# Patient Record
Sex: Male | Born: 1970 | Race: White | Hispanic: No | Marital: Single | Smoking: Never smoker
Health system: Southern US, Community
[De-identification: ages and names within clinical notes are randomized; demographics above are authoritative.]

## PROBLEM LIST (undated history)

## (undated) DIAGNOSIS — I2510 Atherosclerotic heart disease of native coronary artery without angina pectoris: Secondary | ICD-10-CM

## (undated) DIAGNOSIS — G4733 Obstructive sleep apnea (adult) (pediatric): Secondary | ICD-10-CM

## (undated) DIAGNOSIS — I219 Acute myocardial infarction, unspecified: Secondary | ICD-10-CM

## (undated) HISTORY — PX: CARDIAC SURGERY: SHX584

---

## 2003-11-10 ENCOUNTER — Ambulatory Visit (HOSPITAL_COMMUNITY): Admission: RE | Admit: 2003-11-10 | Discharge: 2003-11-10 | Payer: Self-pay | Admitting: Internal Medicine

## 2004-12-13 ENCOUNTER — Inpatient Hospital Stay (HOSPITAL_COMMUNITY): Admission: AD | Admit: 2004-12-13 | Discharge: 2004-12-16 | Payer: Self-pay | Admitting: Cardiology

## 2004-12-15 ENCOUNTER — Ambulatory Visit: Payer: Self-pay | Admitting: Cardiology

## 2004-12-27 ENCOUNTER — Ambulatory Visit: Payer: Self-pay | Admitting: *Deleted

## 2005-01-10 ENCOUNTER — Ambulatory Visit: Payer: Self-pay | Admitting: *Deleted

## 2005-01-17 ENCOUNTER — Ambulatory Visit: Payer: Self-pay | Admitting: Internal Medicine

## 2005-01-17 ENCOUNTER — Encounter (HOSPITAL_COMMUNITY): Admission: RE | Admit: 2005-01-17 | Discharge: 2005-02-16 | Payer: Self-pay | Admitting: *Deleted

## 2005-01-22 ENCOUNTER — Ambulatory Visit: Payer: Self-pay | Admitting: *Deleted

## 2005-04-04 ENCOUNTER — Ambulatory Visit: Payer: Self-pay | Admitting: *Deleted

## 2005-06-06 ENCOUNTER — Ambulatory Visit: Payer: Self-pay | Admitting: Cardiology

## 2005-10-17 ENCOUNTER — Emergency Department (HOSPITAL_COMMUNITY): Admission: EM | Admit: 2005-10-17 | Discharge: 2005-10-17 | Payer: Self-pay | Admitting: Emergency Medicine

## 2006-08-10 ENCOUNTER — Observation Stay (HOSPITAL_COMMUNITY): Admission: AD | Admit: 2006-08-10 | Discharge: 2006-08-11 | Payer: Self-pay | Admitting: Cardiology

## 2006-08-10 ENCOUNTER — Ambulatory Visit: Payer: Self-pay | Admitting: Cardiology

## 2007-08-23 ENCOUNTER — Emergency Department (HOSPITAL_COMMUNITY): Admission: EM | Admit: 2007-08-23 | Discharge: 2007-08-23 | Payer: Self-pay | Admitting: Emergency Medicine

## 2008-11-05 ENCOUNTER — Encounter: Payer: Self-pay | Admitting: Emergency Medicine

## 2008-11-05 ENCOUNTER — Ambulatory Visit: Payer: Self-pay | Admitting: Cardiology

## 2008-11-06 ENCOUNTER — Encounter: Payer: Self-pay | Admitting: Cardiology

## 2008-11-06 ENCOUNTER — Inpatient Hospital Stay (HOSPITAL_COMMUNITY): Admission: EM | Admit: 2008-11-06 | Discharge: 2008-11-07 | Payer: Self-pay | Admitting: Cardiology

## 2008-11-08 DIAGNOSIS — I10 Essential (primary) hypertension: Secondary | ICD-10-CM | POA: Insufficient documentation

## 2008-11-08 DIAGNOSIS — I2510 Atherosclerotic heart disease of native coronary artery without angina pectoris: Secondary | ICD-10-CM | POA: Insufficient documentation

## 2008-11-08 DIAGNOSIS — E785 Hyperlipidemia, unspecified: Secondary | ICD-10-CM | POA: Insufficient documentation

## 2009-04-19 ENCOUNTER — Observation Stay (HOSPITAL_COMMUNITY): Admission: EM | Admit: 2009-04-19 | Discharge: 2009-04-22 | Payer: Self-pay | Admitting: Emergency Medicine

## 2009-04-19 ENCOUNTER — Ambulatory Visit: Payer: Self-pay | Admitting: Cardiovascular Disease

## 2009-05-01 ENCOUNTER — Ambulatory Visit: Admission: RE | Admit: 2009-05-01 | Discharge: 2009-05-01 | Payer: Self-pay | Admitting: Pulmonary Disease

## 2009-05-02 ENCOUNTER — Encounter (INDEPENDENT_AMBULATORY_CARE_PROVIDER_SITE_OTHER): Payer: Self-pay | Admitting: *Deleted

## 2009-05-24 ENCOUNTER — Encounter (INDEPENDENT_AMBULATORY_CARE_PROVIDER_SITE_OTHER): Payer: Self-pay | Admitting: *Deleted

## 2010-04-24 NOTE — Letter (Signed)
Summary: Generic Letter, Intro to Referring  [facility]  [address]   [place], [place] [address]   Phone: [phone]  Fax: [phone]      May 24, 2009             RE: [name] [name]   1970-11-24                 [address]                 [place], [place]  [address]  Dear Kemper Durie    You referred this patient to our office for consult. We have tried to contact patient by phone and mail. Patient has failed to contact our office for appointment.   Sincerely,   [facility] Ph: [phone]   Fax: [phone]

## 2010-04-24 NOTE — Consult Note (Signed)
Summary: [facility]  [facility]   Imported By: Roderic Ovens 04/25/2009 10:04:24  _____________________________________________________________________  External Attachment:    Type:   Image     Comment:   External Document

## 2010-04-24 NOTE — Letter (Signed)
Summary: Appointment Reminder  [facility]  [address]   [place], [place] [address]   Phone: [phone]  Fax: [phone]       May 02, 2009   [name] [name] [address] [place], [place]  [address] 15-Sep-1970    Dear [name],  We have been unable to reach you by phone to schedule a follow up   appointment that was recommended for you by Dr. Darrick Penna. It is very   important that we reach you to schedule an appointment. We hope that you  allow [place] to participate in your health care needs. Please contact us at  [phone] at your earliest convenience to schedule your appointment.  Sincerely,    [facility] R. Roetta Sessions, M.D.    Kassie Mends, M.D. Lorenza Burton, FNP-BC    Tana Coast, PA-C Phone: [phone]    Fax: [phone]  Appended Document: Appointment Reminder LETTER RETURNED. WRONG ADDRESS. GLU

## 2010-06-09 LAB — CBC
HCT: 39.7 % (ref 39.0–52.0)
HCT: 40.9 % (ref 39.0–52.0)
HCT: 42 % (ref 39.0–52.0)
HCT: 44.3 % (ref 39.0–52.0)
Hemoglobin: 13.4 g/dL (ref 13.0–17.0)
Hemoglobin: 15.1 g/dL (ref 13.0–17.0)
MCHC: 33.8 g/dL (ref 30.0–36.0)
MCHC: 34 g/dL (ref 30.0–36.0)
MCHC: 34.5 g/dL (ref 30.0–36.0)
MCV: 93.9 fL (ref 78.0–100.0)
MCV: 94.4 fL (ref 78.0–100.0)
MCV: 94.8 fL (ref 78.0–100.0)
Platelets: 168 10*3/uL (ref 150–400)
Platelets: 181 10*3/uL (ref 150–400)
Platelets: 181 10*3/uL (ref 150–400)
Platelets: 189 10*3/uL (ref 150–400)
RBC: 4.19 MIL/uL — ABNORMAL LOW (ref 4.22–5.81)
RBC: 4.72 MIL/uL (ref 4.22–5.81)
RDW: 12.7 % (ref 11.5–15.5)
RDW: 12.8 % (ref 11.5–15.5)
RDW: 12.9 % (ref 11.5–15.5)
RDW: 13 % (ref 11.5–15.5)
WBC: 10.6 10*3/uL — ABNORMAL HIGH (ref 4.0–10.5)
WBC: 10.7 10*3/uL — ABNORMAL HIGH (ref 4.0–10.5)
WBC: 5.7 10*3/uL (ref 4.0–10.5)
WBC: 6.5 10*3/uL (ref 4.0–10.5)

## 2010-06-09 LAB — CK TOTAL AND CKMB (NOT AT ARMC)
CK, MB: 1.5 ng/mL (ref 0.3–4.0)
Relative Index: 1 (ref 0.0–2.5)
Total CK: 148 U/L (ref 7–232)

## 2010-06-09 LAB — POCT CARDIAC MARKERS
CKMB, poc: <1 ng/mL — ABNORMAL LOW (ref 1.0–8.0)
Myoglobin, poc: 57.2 ng/mL (ref 12–200)
Troponin i, poc: <0.05 ng/mL (ref 0.00–0.09)

## 2010-06-09 LAB — BASIC METABOLIC PANEL
BUN: 14 mg/dL (ref 6–23)
CO2: 22 mEq/L (ref 19–32)
Calcium: 9.5 mg/dL (ref 8.4–10.5)
Chloride: 104 mEq/L (ref 96–112)
Creatinine, Ser: 1.32 mg/dL (ref 0.4–1.5)
GFR calc Af Amer: >60 mL/min (ref >60)
GFR calc non Af Amer: >60 mL/min (ref >60)
Glucose, Bld: 104 mg/dL — ABNORMAL HIGH (ref 70–99)
Potassium: 3.7 mEq/L (ref 3.5–5.1)
Sodium: 137 mEq/L (ref 135–145)

## 2010-06-09 LAB — DIFFERENTIAL
Basophils Absolute: 0 10*3/uL (ref 0.0–0.1)
Basophils Absolute: 0 10*3/uL (ref 0.0–0.1)
Basophils Absolute: 0 10*3/uL (ref 0.0–0.1)
Basophils Absolute: 0 10*3/uL (ref 0.0–0.1)
Basophils Relative: 0 % (ref 0–1)
Basophils Relative: 0 % (ref 0–1)
Basophils Relative: 0 % (ref 0–1)
Eosinophils Absolute: 0.1 10*3/uL (ref 0.0–0.7)
Eosinophils Absolute: 0.1 10*3/uL (ref 0.0–0.7)
Eosinophils Absolute: 0.2 10*3/uL (ref 0.0–0.7)
Eosinophils Relative: 1 % (ref 0–5)
Eosinophils Relative: 1 % (ref 0–5)
Eosinophils Relative: 4 % (ref 0–5)
Eosinophils Relative: 5 % (ref 0–5)
Lymphocytes Relative: 16 % (ref 12–46)
Lymphocytes Relative: 16 % (ref 12–46)
Lymphocytes Relative: 31 % (ref 12–46)
Lymphs Abs: 1.7 10*3/uL (ref 0.7–4.0)
Lymphs Abs: 1.7 10*3/uL (ref 0.7–4.0)
Lymphs Abs: 2 10*3/uL (ref 0.7–4.0)
Monocytes Absolute: 0.8 10*3/uL (ref 0.1–1.0)
Monocytes Absolute: 1 10*3/uL (ref 0.1–1.0)
Monocytes Relative: 8 % (ref 3–12)
Monocytes Relative: 9 % (ref 3–12)
Neutro Abs: 7.8 10*3/uL — ABNORMAL HIGH (ref 1.7–7.7)
Neutro Abs: 8 10*3/uL — ABNORMAL HIGH (ref 1.7–7.7)
Neutrophils Relative %: 47 % (ref 43–77)
Neutrophils Relative %: 54 % (ref 43–77)
Neutrophils Relative %: 73 % (ref 43–77)
Neutrophils Relative %: 75 % (ref 43–77)

## 2010-06-09 LAB — CARDIAC PANEL(CRET KIN+CKTOT+MB+TROPI)
CK, MB: 1 ng/mL (ref 0.3–4.0)
CK, MB: 1 ng/mL (ref 0.3–4.0)
Relative Index: 0.9 (ref 0.0–2.5)
Relative Index: INVALID (ref 0.0–2.5)
Total CK: 112 U/L (ref 7–232)
Total CK: 90 U/L (ref 7–232)
Troponin I: 0.02 ng/mL (ref 0.00–0.06)
Troponin I: <0.01 ng/mL (ref 0.00–0.06)

## 2010-06-09 LAB — TROPONIN I: Troponin I: 0.01 ng/mL (ref 0.00–0.06)

## 2010-06-09 LAB — HEPARIN LEVEL (UNFRACTIONATED)
Heparin Unfractionated: 0.74 IU/mL — ABNORMAL HIGH (ref 0.30–0.70)
Heparin Unfractionated: 0.81 IU/mL — ABNORMAL HIGH (ref 0.30–0.70)
Heparin Unfractionated: 0.97 IU/mL — ABNORMAL HIGH (ref 0.30–0.70)
Heparin Unfractionated: <0.1 IU/mL — ABNORMAL LOW (ref 0.30–0.70)
Heparin Unfractionated: <0.1 IU/mL — ABNORMAL LOW (ref 0.30–0.70)

## 2010-06-09 LAB — PROTIME-INR
INR: 1.03 (ref 0.00–1.49)
Prothrombin Time: 13.4 seconds (ref 11.6–15.2)

## 2010-06-09 LAB — APTT: aPTT: 25 seconds (ref 24–37)

## 2010-06-29 LAB — BASIC METABOLIC PANEL
CO2: 25 mEq/L (ref 19–32)
Calcium: 8.9 mg/dL (ref 8.4–10.5)
Chloride: 105 mEq/L (ref 96–112)
GFR calc Af Amer: >60 mL/min (ref >60)
GFR calc non Af Amer: >60 mL/min (ref >60)
Glucose, Bld: 107 mg/dL — ABNORMAL HIGH (ref 70–99)
Potassium: 3.8 mEq/L (ref 3.5–5.1)
Potassium: 4 mEq/L (ref 3.5–5.1)
Sodium: 140 mEq/L (ref 135–145)
Sodium: 137 mEq/L (ref 135–145)

## 2010-06-29 LAB — DIFFERENTIAL
Basophils Absolute: 0 10*3/uL (ref 0.0–0.1)
Basophils Relative: 0 % (ref 0–1)
Eosinophils Relative: 2 % (ref 0–5)
Eosinophils Relative: 2 % (ref 0–5)
Lymphocytes Relative: 47 % — ABNORMAL HIGH (ref 12–46)
Lymphocytes Relative: 38 % (ref 12–46)
Lymphs Abs: 3.6 10*3/uL (ref 0.7–4.0)
Monocytes Absolute: 0.7 10*3/uL (ref 0.1–1.0)
Monocytes Absolute: 0.9 10*3/uL (ref 0.1–1.0)
Neutro Abs: 3.5 10*3/uL (ref 1.7–7.7)

## 2010-06-29 LAB — CARDIAC PANEL(CRET KIN+CKTOT+MB+TROPI)
CK, MB: 1.3 ng/mL (ref 0.3–4.0)
Relative Index: 1.1 (ref 0.0–2.5)
Relative Index: INVALID (ref 0.0–2.5)
Relative Index: INVALID (ref 0.0–2.5)
Troponin I: 0.01 ng/mL (ref 0.00–0.06)

## 2010-06-29 LAB — CBC
HCT: 40.2 % (ref 39.0–52.0)
HCT: 45.4 % (ref 39.0–52.0)
Hemoglobin: 13.8 g/dL (ref 13.0–17.0)
Hemoglobin: 15.6 g/dL (ref 13.0–17.0)
MCHC: 34.4 g/dL (ref 30.0–36.0)
Platelets: 178 10*3/uL (ref 150–400)
RDW: 13.1 % (ref 11.5–15.5)
RDW: 13 % (ref 11.5–15.5)
WBC: 9.4 10*3/uL (ref 4.0–10.5)

## 2010-06-29 LAB — COMPREHENSIVE METABOLIC PANEL
BUN: 14 mg/dL (ref 6–23)
CO2: 25 mEq/L (ref 19–32)
Calcium: 8.9 mg/dL (ref 8.4–10.5)
Chloride: 109 mEq/L (ref 96–112)
Creatinine, Ser: 1.37 mg/dL (ref 0.4–1.5)
GFR calc non Af Amer: 58 mL/min — ABNORMAL LOW (ref >60)
Glucose, Bld: 105 mg/dL — ABNORMAL HIGH (ref 70–99)
Total Bilirubin: 0.8 mg/dL (ref 0.3–1.2)

## 2010-06-29 LAB — LIPID PANEL
Cholesterol: 144 mg/dL (ref 0–200)
HDL: 43 mg/dL (ref >39)
LDL Cholesterol: 70 mg/dL (ref 0–99)
Total CHOL/HDL Ratio: 3.3 RATIO
Triglycerides: 154 mg/dL — ABNORMAL HIGH (ref <150)

## 2010-06-29 LAB — PROTIME-INR: Prothrombin Time: 13.8 seconds (ref 11.6–15.2)

## 2010-06-29 LAB — POCT CARDIAC MARKERS: Troponin i, poc: <0.05 ng/mL (ref 0.00–0.09)

## 2010-06-29 LAB — MAGNESIUM: Magnesium: 2.2 mg/dL (ref 1.5–2.5)

## 2010-06-29 LAB — TSH: TSH: 2.07 u[IU]/mL (ref 0.350–4.500)

## 2010-06-29 LAB — HEMOGLOBIN A1C
Hgb A1c MFr Bld: 5.5 % (ref 4.6–6.1)
Mean Plasma Glucose: 111 mg/dL

## 2010-07-04 ENCOUNTER — Other Ambulatory Visit: Payer: Self-pay | Admitting: Nurse Practitioner

## 2010-08-06 NOTE — H&P (Signed)
[name] [name], [name] [name]                  ACCOUNT NO.:  [id number]   MEDICAL RECORD NO.:  [id number]          PATIENT TYPE:  INP   LOCATION:  2113                         FACILITY:  [facility]   PHYSICIAN:  Dorian Pod, ACNP  DATE OF BIRTH:  06/21/1970   DATE OF ADMISSION:  08/10/2006  DATE OF DISCHARGE:                              HISTORY & PHYSICAL   CARDIOLOGIST:  Previously followed by Dionicio Stall, will be followed by  Donnamarie Rossetti now.   PRIMARY CARE PHYSICIAN:  Shaune Pollack.   Mr. Harju is a very pleasant 40 year old Caucasian gentleman with known  history of coronary artery disease, status post non-ST elevated MI in  September of 2006.  At that time, underwent cardiac catheterization with  placement of a bare metal stent to the right coronary artery.  The  patient was a participant in the Triton study.  Apparently followed up  with Dr. Dorethea Clan later that same year with complaints of intermittent  chest discomfort.  He was apparently removed from the Triton study and  started on Plavix at that time.  Details are unavailable.  He states he  has done well for the last couple of years until about 1 week ago on a  Monday, he was at work and experienced some stabbing chest discomfort  associated with some tightness substernally.  He states this lasted for  several hours.  He left work, went home and went to bed.  He took 2  nitroglycerin sublingually and the chest discomfort eased off.  He had  no further chest pain at that time.  He was okay until this Sunday  (yesterday) around 8 p.m., he was watching T.V. and once again  experienced substernal chest discomfort that started out as tightness  radiating to his left chest.  He states it lasted for several hours.  He  took nitroglycerin without relief.  He did not fell well though.  He  fell asleep about 2 a.m.  He woke up at 6 this morning, the chest  discomfort returned.  It was more intense.  He felt weak and dizzy.  He  denied any nausea,  vomiting, or diaphoresis.  He went to work, states he  just felt funny all morning.  Finally went to the ER at [facility].   PAST MEDICAL HISTORY:  1. Migraines.  2. Hypertension.  3. Coronary artery disease.  Status post cath in 2006 with seven      vessel disease.  Placement of a bare metal stent to the right      coronary artery.  Participant of the Triton study, however      currently on Plavix.  4. Dyslipidemia.  5. History of poly substance abuse.  Previous tobacco user, quit in      20 06.  Ongoing marijuana use, uses every other evening.  Drinks a 6-      pack of beer on the weekends.   ALLERGIES:  NO KNOWN DRUG ALLERGIES.   MEDICATIONS:  1. Caduet.  The patient thinks it is 5/80 mg daily.  2. Plavix 75.  3.  Aspirin 81.  4. Protonix 40.   SOCIAL HISTORY:  He lives in [place].  He lives alone.  He has never been  married.  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  No exercise.  Quit tobacco use in 2006.  Drinks a 6-pack on the  weekends.  Tries to follow a heart-healthy diet, he states this is  difficult as he is out of town frequently for work.  Ongoing marijuana  use.  Denies any crack cocaine or methamphetamine.   FAMILY HISTORY:  Mother alive and well with known history of coronary  artery disease, positive for stents.  Father alive and well with  coronary artery disease, status post MI.  Sister with no known coronary  artery disease.   REVIEW OF SYSTEMS:  Positive for headaches.  Blurred vision  occasionally.  Chest pain.  Mild dyspnea on exertion.  Coughing,  wheezing, nocturia, and GERD symptoms.  All other systems are negative  per the patient.   PHYSICAL EXAMINATION:  VITAL SIGNS:  Pulse 68, respirations 18, blood  pressure 120/72.  The patient is sating at 99% on two liters.  GENERAL:  He is in no acute distress.  He is rubbing his chest  complaining of chest pain.  On a scale of 1 to 10 it is currently a  five.  A very pleasant,  cooperative gentleman.  HEENT:  Normocephalic, atraumatic.  Pupils equal, round, and reactive to  light.  Sclerae clear.  Mucus membranes moist.  He has his own teeth.  NECK:  Supple without lymphadenopathy.  No bruits.  No JVD.  CARDIOVASCULAR EXAM:  S1, S2 regular rate and rhythm.  No murmurs, rubs,  or gallops heard.  Pulses are 2+ and equal without bruits.  LUNGS:  Clear to auscultation.  SKIN:  Warm and dry.  ABDOMEN:  Soft, nontender.  Positive bowel sounds.  EXTREMITIES:  Lower extremities without clubbing, cyanosis, or edema.  NEUROLOGICALLY:  Alert and oriented times 3.  Cranial nerves II-XII  grossly intact.   EKG obtained at [facility] shows sinus rhythm with T wave  inversions in the anterior leads.  A repeat 12-lead here showing sinus  rhythm with T wave inversions in the anterior leads.  No change from  previous EKG obtained this morning at [facility].  Baseline lab work  obtained at [facility].  H and H 15.3 and 43.6, WBC 7.9, platelets  208,000.  Sodium 137, potassium 3.8, chloride 108, CO2 23, BUN 17,  creatinine 1.2, with a glucose of 104.  Troponin point-of-care less than  0.05.  First set of cardiac enzymes negative troponin.  PT 12.5, INR  0.9.  None of these labs were obtained at [facility]  Patient  with complaints of chest discomfort with known history of coronary  artery disease.  Status post non ST elevated MI with a stent to the RCA  and in 2006.  History of dyslipidemia, hypertension, and poly substance  abuse.  Currently having ongoing chest pain.  Heparin, nitroglycerin,  and Integrilin already started at [facility] and continuing.  The patient will need cardiac catheterization for further evaluation.  Dr. Simona Huh went to examine the patient and is in agreement with  the above plan.  Will continue his calcium channel blocker, Protonix,  aspirin, Statin, and Plavix.  Check fasting lipids in the a.m.  Proceed with cardiac  catheterization for further evaluation of discomfort.      Dorian Pod, ACNP  MB/MEDQ  D:  08/10/2006  T:  08/10/2006  Job:  [id number]

## 2010-08-06 NOTE — Cardiovascular Report (Signed)
[name] [name], [name] [name]                  ACCOUNT NO.:  [id number]   MEDICAL RECORD NO.:  [id number]          PATIENT TYPE:  INP   LOCATION:  2507                         FACILITY[facility]   PHYSICIAN:  Noralyn Pick. Eden Emms, MD, FACCDATE OF BIRTH:  January 21, 1971   DATE OF PROCEDURE:  DATE OF DISCHARGE:                            CARDIAC CATHETERIZATION   INDICATION:  A 40 year old patient with chest pain, previous bare-metal  stent to the right coronary artery in 2006.   Cine catheterization was done from right femoral artery using 5-French  sheath.   Left main coronary artery was normal.   Left anterior descending artery was normal.   There was a medium-sized intermediate branch, which was normal.   First diagonal branch was normal.   Circumflex coronary artery was nondominant and normal.  First obtuse  marginal branch was normal.   Right coronary artery was dominant.  There was a stent in the distal  right coronary artery and the proximal mid and distal right coronary  artery were widely patent as were the PDA and posterior lateral branch.   RAO ventriculography:  RA ventriculography was normal.  EF was 55%.  There was no grain across aortic valve and no MR.  Aortic pressure was  115/70, LV pressure was 118/70.   IMPRESSION:  The patient has no coronary artery disease.  We did shoot a  single LAO aortogram to rule out aortic dissection since the patient had  significant chest pain and no coronary disease.  There was no evidence  of aortic dissection or aneurysm.  The patient was fairly bradycardic in  the lab with heart rates ranging from 42-55.  Giving him ice chips seem  to help.  He was always in sinus rhythm, but no heart block.  Since it  is late in the day and there is an issue with his relative bradycardia,  we will keep him until the morning.  If his heart rate is okay and he  has no further chest pain and his groin has healed well.  He will be  discharged in the morning.  I  suspect that a possible exercise treadmill  may be in order as an outpatient to rule out chronotropic incompetence.   The patient tolerated the procedure well.      Noralyn Pick. Eden Emms, MD, [facility]  Electronically Signed     PCN/MEDQ  D:  11/06/2008  T:  11/07/2008  Job:  [id number]

## 2010-08-06 NOTE — Discharge Summary (Signed)
NAMERYDER, MAN                  ACCOUNT NO.:  [id number]   MEDICAL RECORD NO.:  [id number]          PATIENT TYPE:  INP   LOCATION:  2507                         [facility]ACILITY[facility]   PHYSICIAN:  Jesse Sans. Wall, MD, FACCDATE OF BIRTH:  [birth year]   DATE OF ADMISSION:  11/06/2008  DATE OF DISCHARGE:  11/07/2008                               DISCHARGE SUMMARY   PRIMARY CARDIOLOGIST:  Gerrit Friends. Dietrich Pates, MD, [facility]   PRIMARY CARE Tanise Russman:  Venora Maples, MD   DISCHARGE DIAGNOSIS:  Chest pain.   SECONDARY DIAGNOSES:  1. Coronary artery disease, status post previous bare-metal stenting      of the right coronary artery.  2. Hypertension.  3. Hyperlipidemia.  4. Asymptomatic sinus bradycardia.   ALLERGIES:  No known drug allergies.   PROCEDURE:  Left heart cardiac catheterization revealing nonobstructive  coronary artery disease with normal left ventricular function and patent  previously placed right coronary artery stent.   HISTORY OF PRESENT ILLNESS:  A 40 year old Caucasian male with prior  history of CAD, who was in his usual state of health until November 05, 2008, when he presented to the [facility] ED with complaints of chest  pain.  There his cardiac enzymes were negative and ECG showed no acute  changes.  Decision was made to transfer to [facility] for further  evaluation.   HOSPITAL COURSE:  The patient was ruled out for MI.  He underwent left  heart cardiac catheterization on November 06, 2008, revealing normal  coronary arteries with patent RCA stent.  EF was 55%.  Mr. Kraai has had  no recurrent chest discomfort, be discharged home today in good  condition.  We have added low-dose aspirin as well as statin therapy to  his home regimen (the patient was not taking anything at home).   DISCHARGE LABORATORIES:  Hemoglobin 13.8, hematocrit 40.2, WBC 8.3, and  platelets 178.  INR 1.1.  Sodium 140, potassium 3.8, chloride 108, CO2  of 25, BUN 13, creatinine 1.22, and glucose  95.  Total bilirubin 0.8 and  alkaline phosphatase 31.  AST 18, ALT 16, total protein 6.1, albumin  3.7, calcium 8.9, magnesium 2.2, and hemoglobin A1c 5.5.  CK 78, MB 1.1,  troponin I 0.02, total cholesterol 144, triglycerides 154, HDL 43, and  LDL 70.  TSH is 2.07.   DISPOSITION:  The patient is being discharged home today in good  condition.   FOLLOWUP PLANS AND APPOINTMENTS:  We have arranged for Mr. lead to  follow up with Joni Reining, nurse practitioner at [facility] office  on November 21, 2008, at 2:30 p.m.  He is asked to follow up with Dr.  Juanetta Gosling as previously scheduled.   DISCHARGE MEDICATIONS:  1. Aspirin 81 mg daily.  2. Zocor 20 mg at bedtime.   OUTSTANDING LABORATORY STUDIES:  None.   DURATION OF DISCHARGE ENCOUNTER:  35 minutes including physician time.      Nicolasa Ducking, ANP      Jesse Sans. Daleen Squibb, MD, [facility]  Electronically Signed    CB/MEDQ  D:  11/07/2008  T:  11/07/2008  Job:  [id number]   cc:   Venora Maples

## 2010-08-06 NOTE — Discharge Summary (Signed)
NAMEASHAD, FAWBUSH                  ACCOUNT NO.:  [id number]   MEDICAL RECORD NO.:  [id number]          PATIENT TYPE:  INP   LOCATION:  6525                         [facility]   PHYSICIAN:  Veverly Fells. Excell Seltzer, MD  DATE OF BIRTH:  [birth year]   DATE OF ADMISSION:  08/10/2006  DATE OF DISCHARGE:  08/11/2006                               DISCHARGE SUMMARY   PRIMARY CARDIOLOGIST:  Gerrit Friends. Dietrich Pates, MD, [facility].   PRIMARY CARE Pammy Vesey:  Oneal Deputy. Juanetta Gosling, M.D.   PRINCIPAL DIAGNOSIS:  Chest pain.   SECONDARY DIAGNOSES:  1. Coronary artery disease status post bare metal stenting of the      right coronary artery in 2006.  2. Hypertension.  3. Hyperlipidemia.  4. History of polysubstance abuse including remote tobacco abuse,      quitting in 2006, and ongoing marijuana usage and EtOH usage.      Positive urine drug screen for cocaine on this admission  5. History of migraine headaches.   ALLERGIES:  No known drug allergies.   PROCEDURE:  Left heart cardiac catheterization and 2-D echocardiogram.   HISTORY OF PRESENT ILLNESS:  A  40 year old Caucasian male with prior  history of CAD status post non-ST-elevation MI in September 2006 with  subsequent PCI and stenting of the right coronary artery with bare metal  stent.  He had done well until approximately 1 week prior to admission  when he noted some stabbing chest discomfort associated with substernal  tightness lasting for several hours and eventually resolved following  two sublingual nitroglycerin.  He then did well until the evening of May  18 when, while watching TV, he had sudden onset of substernal chest  discomfort and tightness radiating to his left chest, again lasting  several hours. At this time unrelieved by sublingual nitroglycerin.  He  eventually fell off to sleep, and when he woke up on the morning of May  19, discomfort returned, and he presented to [facility].  Following evaluation there where ECG  showed sinus rhythm with T-wave  inversion in the anterior leads, decision made to transfer him to [facility] for further evaluation.   HOSPITAL COURSE:  The patient ruled out for MI by cardiac markers.  He  underwent left heart cardiac catheterization on May 19 which revealed  nonobstructive coronary artery disease with an EF of 60%.  The  previously placed stent was widely patent.  Post catheterization, the  patient complained of increasing chest discomfort which was pleuritic in  nature and worsen with deep breathing.  As a result, a D-dimer was  drawn, and this was normal at less than 0.22.  Further, a 2-D  echocardiogram was performed this afternoon which revealed an EF of 50-  55% with no wall motion abnormalities or evidence of pericardial  effusion.  The patient has been managed with ibuprofen with improvement  in his pleuritic chest discomfort.   He is being discharged home today in satisfactory condition.   DISCHARGE LABORATORY DATA:  Hemoglobin 14.5, hematocrit 43.1, WBC 7.7,  platelets 196.  PT 13.5, INR  1.0.  Sodium 139, potassium 4.1, chloride  108, CO2 25, BUN 17, creatinine 0.28, glucose 104. Total bilirubin 0.6,  alkaline phosphatase 30, AST 15, ALT 15, total protein 6.0, albumin 3.5,  calcium 9.1. CK 77, MB 1.2, troponin I 0.02. Total cholesterol 72,  triglycerides 110, HDL 27, LDL 23. Of note, his urine drug screen was  positive for cocaine, opiates, THC and benzodiazepines.   DISPOSITION:  The patient is being discharged home today in good  condition.  He has been counseled on the importance of complete  cessation of all drugs and alcohol.   FOLLOWUP APPOINTMENTS:  He is follow up with Dr. Dietrich Pates on June 3 at  1:15 p.m.  He has followup with Dr. Juanetta Gosling as previously scheduled.   DISCHARGE MEDICATIONS:  1. Aspirin 325 mg daily.  2. Plavix 75 mA daily.  3. Caduet 5/80 mg daily.  4. Protonix 40 mA daily.  5/  Nitroglycerin 0.4 mg sublingual p.r.n. chest  pain.  1. Xanax as previously prescribed.  2. Vicodin as previously prescribed.  3. Ibuprofen 200 mg 3 tablets q.6 h p.r.n. fever.   OUTSTANDING LABORATORY STUDIES:  None.   Duration of discharge encounter:  50 minutes including physician time.      Nicolasa Ducking, ANP      Veverly Fells. Excell Seltzer, MD  Electronically Signed    CB/MEDQ  D:  08/11/2006  T:  08/11/2006  Job:  [id number]   cc:   Gerrit Friends. Dietrich Pates, MD, [facility]  Oneal Deputy. Juanetta Gosling, M.D.

## 2010-08-06 NOTE — Cardiovascular Report (Signed)
NAMEJERAD, DUNLAP                  ACCOUNT NO.:  [id number]   MEDICAL RECORD NO.:  [id number]          PATIENT TYPE:  INP   LOCATION:  2807                         FACILITY[facility]   PHYSICIAN:  Everardo Beals. Juanda Chance, MD, FACCDATE OF BIRTH:  11/18/1970   DATE OF PROCEDURE:  08/10/2006  DATE OF DISCHARGE:                            CARDIAC CATHETERIZATION   CLINICAL HISTORY:  Mr. 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  He came to the emergency room  today with substernal chest pain not relieved with nitroglycerin.  He  was seen by Dr. Diona Browner.  The EKG showed anterior T wave changes.  Initial markers were negative.  A decision was made a take him to the  catheterization laboratory for further evaluation with angiography.   PROCEDURE:  The procedure was performed via the right femoral artery  using an arterial sheath and 6-French preformed coronary catheters.  A  frontal wall arterial puncture was performed. Omnipaque contrast was  used.  The patient tolerated the procedure well and left the laboratory  in satisfactory condition.  The right femoral was closed with Angio-Seal  at the end of the procedure.   RESULTS:  The left main coronary artery vein was free of significant  disease.   Left anterior descending artery gave rise to a large optional diagonal  branch and three septal perforators.  These and the LAD proper were free  of significant disease.   The circumflex artery gave rise to large ramus branch, a small marginal  branch, and a posterolateral branch.  These vessels were free of  significant disease.   The right coronary artery was a moderately large vessel that gave rise  to right conus branch, right ventricle branch, posterior descending  branch, and two posterolateral branches.  There was less than 10%  stenosis at the stent site in the distal right  coronary artery before  the posterior descending branch.   The left ventriculogram was performed in the RAO projection and showed  good wall motion with no areas of hypokinesis.  Estimated ejection  fraction was 6%.   Aortic pressure was 114/76 with mean of 88.  The left ventricular  pressure was 114/11.   CONCLUSION:  Minimal nonobstructive coronary artery disease with no  significant obstruction in the left anterior descending artery and  circumflex arteries, less than 10% stenosis at the stent site in the  distal right coronary artery, and normal left ventricular function with  estimated ejection fraction of 60%.   RECOMMENDATIONS:  The patient has no source of ischemia.  In view of  these findings, I think it is likely ischemia.  He does have slightly  slow flow in the LAD and right coronary arteries.  It took about three  cycles to completely fill the vessels.  The clinical significance is not  certain but probably not related to his chest pain.  Will plan  reassurance.  He also a history of polysubstance abuse possibly  could be  playing some role.      Bruce Elvera Lennox Juanda Chance, MD, [facility]  Electronically Signed     BRB/MEDQ  D:  08/10/2006  T:  08/10/2006  Job:  [id number]   cc:   Gerrit Friends. Dietrich Pates, MD, [facility]  Oneal Deputy. Juanetta Gosling, M.D.  Jonelle Sidle, MD  Cardiopulmonary Lab

## 2010-08-06 NOTE — H&P (Signed)
[name], [name] [name]                  ACCOUNT NO.:  [id number]   MEDICAL RECORD NO.:  [id number]          PATIENT TYPE:  INP   LOCATION:  2610                         FACILITY:  [facility]   PHYSICIAN:  Selena Batten, MD     DATE OF BIRTH:  [birth year]   DATE OF ADMISSION:  11/06/2008  DATE OF DISCHARGE:                              HISTORY & PHYSICAL   CHIEF COMPLAINTS:  Chest pain.   HISTORY OF PRESENT ILLNESS:  This is a 40 year old Caucasian male with a  known history of CAD status post BMS to his RCA, back in 2006 who  presents with onset of chest pain yesterday.  The patient awoke and had  breakfast on Sunday morning, 08/15, after which he developed this  squeezing chest pain across his precordium.  It was present throughout  the day and increased in intensity.  There were no precipitating or  relieving factors the patient was able to identify.  He presented to  [facility] emergency department for further evaluation.  On arrival, he  was placed on nitro drip, received morphine, was given aspirin and was  transferred for further evaluation.  On arrival, the patient had 1/10  chest pain, he was resting comfortably in bed.  He denies any other  recent symptoms.  He denies any orthopnea, PND, lower extremity edema.  He denies any decrease in his exercise tolerance apart from today.  Of  note, the patient states that he drinks 3-4 beers per day.   PAST MEDICAL HISTORY:  1. CAD status post PCI to the RCA in 2006 with a BMS.  2. Hypertension, presently diet controlled.  3. Hyperlipidemia, presently diet controlled.  4. Prior history of polysubstance abuse.   SOCIAL HISTORY:  He drinks 3-4 beers per day.  He does not smoke.   FAMILY HISTORY:  Multiple males in his family expired in their 78-50s  from heart disease.   REVIEW OF SYSTEMS:  A 14-point review of system was performed and is  negative except as per HPI.   PHYSICAL EXAMINATION:  VITAL SIGNS:  Blood pressure is 119/89 with  pulse  45, satting 98% on room air.  GENERAL:  No acute distress.  HEENT:  Head is normocephalic, atraumatic.  Eyes:  Pupils equal, round  and reactive to light.  Extraocular movements intact.  NECK:  Supple.  No masses.  Number of bruits.  No thyromegaly.  LUNGS:  Clear to auscultation bilaterally.  HEART:  Regular rate and rhythm without murmurs, rubs or gallops.  ABDOMEN:  Positive bowel sounds, soft, nontender, nondistended.  EXTREMITIES:  No clubbing, cyanosis or edema.  NEUROLOGICAL:  He is alert and oriented x3, moving all extremities x4.  PSYCHIATRIC:  Appropriate mood, judgment, insight.   STUDIES:  Chest x-ray is negative for any acute findings.  EKG is sinus  brady with precordial T-wave inversions.  Initial laboratory evaluation  is unrevealing.  Creatinine is 1.3.  CBC is unremarkable.  Cardiac  enzymes are unremarkable.   ASSESSMENT:  A 40 year old male with known CAD status post a BMS to the  RCA in  2006, hypertension, hyperlipidemia, and ethanol use who presents  with chest pain.  1. Chest pain.  While patient is on aspirin, nitro drip, heparin drip;      if he rules in, we will load and add the patient on Plavix.  Will      check a fasting lipid panel in a.m.  Consider cardiac      catheterization versus stress test.  Given the fact that his      symptoms are similar to previous MI, have discussed with the      patient the possibility of cardiac catheterization.  2. Hypertension.  Continue to monitor and add medications as necessary      to reach optimum goal.  3. Hyperlipidemia.  Will check fasting lipid panel in the a.m.      Consider adding a statin.  4. Ethanol use.  Will monitor closely.  If the patient develops any      anxiety or what not, will treat him with benzodiazepines.   DISPOSITION:  Pending further workup.      Selena Batten, MD  Electronically Signed     BS/MEDQ  D:  11/06/2008  T:  11/06/2008  Job:  [id number]

## 2010-08-09 NOTE — Cardiovascular Report (Signed)
NAMEBINH, [name] [name]                  ACCOUNT NO.:  [id number]   MEDICAL RECORD NO.:  [id number]          PATIENT TYPE:  INP   LOCATION:  3736                         FACILITY:  [facility]   PHYSICIAN:  Salvadore Farber, M.D. LHCDATE OF BIRTH:  03-Feb-1971   DATE OF PROCEDURE:  12/13/2004  DATE OF DISCHARGE:                              CARDIAC CATHETERIZATION   PROCEDURE PERFORMED:  Bare metal placement in the distal right coronary  artery.   CARDIOLOGIST:  Salvadore Farber, M.D.   INDICATIONS:  [name] [name] is a 40 year old gentleman with tobacco abuse and  family history of premature atherosclerotic disease who presents with  multiple episodes of chest pain starting at 2 P.M. this afternoon.  The pain  has been accompanied by nausea, shortness of breath and diaphoresis.  He has  also had some near syncope.  The pain has waxed and waned over the course of  the afternoon.  He is currently pain-free.  Electrocardiograms have  demonstrated anterior T wave inversions and transient inferior ST  elevations.  Dr. Dorethea Clan performed diagnostic angiography emergently.  This  demonstrated thrombus in the distal RCA.  I was asked to consider  percutaneous revascularization.   I reviewed the films.  The stenosis was mild; however, there was a large  thrombus in the distal vessels consistent with ruptured plaque.  This  territory fit with the inferior ST abnormalities as well as a focal area of  inferior hypokinesis.  Since the patient has had multiple episodes of chest  pain at rest including after the administration of aspirin and heparin, I  felt concerned that additional medial therapy may be ineffective in  preventing the completion of the myocardial infarction.  I therefore decided  to proceed with percutaneous revascularization.   PROCEDURAL TECHNIQUE:  There was a preexisting 6 French sheath in the right  common femoral artery.  Via the sheath a 6 [place] JR-4 guide was advanced  over the wire  and engaged in the ostium of the RCA.  A ProWater wire was  advanced in the distal RCA without difficulty.  The lesion was directly  stented using a 3.5 x 15 mm Vision deployed at 16 atmospheres.  Final  angiography demonstrated no residual stenosis, no residual thrombus, no  dissection and no distal embolization.   The patient tolerated the procedure well and was transferred to the CCU in  stable condition.   COMPLICATIONS:  None.   IMPRESSION AND RECOMMENDATIONS:  Successful bare metal stenting of the  ruptured plaque of the distal right coronary artery.   The patient is enrolled in the TRITON study comparing Plavix with a novel  thienopyridine.  He thus should not receive open label Plavix during  hospitalization or for the duration of the study.  He should be continued on  aspirin indefinitely.  Hydostatin will be initiated.  Smoking cessation has  been strongly advised.  NSAIDs are discontinued.  He will be given a beta  blocker.      Salvadore Farber, M.D. [facility]  Electronically Signed     WED/MEDQ  D:  12/13/2004  T:  12/16/2004  Job:  [id number]   cc:   Oneal Deputy. Juanetta Gosling, M.D.  Fax: [phone]   Vida Roller, M.D.  Fax: [phone]

## 2010-08-09 NOTE — Procedures (Signed)
NAMEKELIJAH, TOWRY                  ACCOUNT NO.:  [id number]   MEDICAL RECORD NO.:  [id number]          PATIENT TYPE:  REC   LOCATION:                                FACILITY:  [facility]   PHYSICIAN:  Vida Roller, M.D.   DATE OF BIRTH:  06-10-1970   DATE OF PROCEDURE:  01/17/2005  DATE OF DISCHARGE:                                    STRESS TEST   HISTORY:  Mr. Moehring is a 40 year old gentleman with coronary disease, status  post non-ST elevation MI in September 2006.  He was found to have a ruptured  plaque in his RCA and underwent bare metal stenting to this lesion.  He now  presents with recurrent exertional chest discomfort.   BASELINE DATA:  Electrocardiogram reveals sinus bradycardia at 53 beats per  minute with nonspecific ST abnormalities.  Blood pressure is 100/60.   The patient exercised for a total of 8 minutes and 29 seconds to Bruce  protocol, stage III, and 10.1 METS.  Maximum heart rate was 159 beats per  minute which is 86% of predicted.  Maximum blood pressure was 168/60.  The  patient reported chest tightness and shortness of breath with exercise that  resolved in recovery.  Electrocardiogram revealed few PVCs, no ischemic  changes noted.   Final images and results are pending M.D. review.      Jae Dire, P.A. [facility]      Vida Roller, M.D.  Electronically Signed    AB/MEDQ  D:  01/17/2005  T:  01/17/2005  Job:  [id number]

## 2010-08-09 NOTE — Discharge Summary (Signed)
NAMESTANCIL, [name] [name]                  ACCOUNT NO.:  [id number]   MEDICAL RECORD NO.:  [id number]          PATIENT TYPE:  INP   LOCATION:  3736                         FACILITY:  [facility]   PHYSICIAN:  Vida Roller, M.D.   DATE OF BIRTH:  05-19-1970   DATE OF ADMISSION:  12/13/2004  DATE OF DISCHARGE:  12/16/2004                                 DISCHARGE SUMMARY   PROCEDURES:  1.  Coronary catheterization.  2.  Coronary arteriogram.  3.  Left ventriculogram.  4.  PTCA and bare metal stent to the RCA.   DISCHARGE DIAGNOSES:  1.  Acute non-ST segment elevation inferior myocardial infarction secondary      to ruptured plaque in the right coronary artery, single vessel disease.  2.  Preserved left ventricular function with ejection fraction 55% and      inferior hypokinesis.  3.  History of migraine headaches.  4.  Ongoing tobacco use.  5.  History of polysubstance abuse, urine drug screen pending at the time of      dictation.  6.  Dyslipidemia with a total cholesterol of 138, triglycerides 172, HDL 31,      LDL 73.  7.  Status post colonoscopy with colon polyps.   HOSPITAL COURSE:  [name] [name] is a 40 year old male with no known history of  coronary artery disease.  He had sudden onset of substernal chest pain on  the day of admission that was associated with  nausea, shortness of breath,  and diaphoresis.  It was also associated with dizziness.  It was an 8/10.  He went to [facility] Emergency Room where nitroglycerin decreased  his pain, but it was not relieved.  He had ST segment depression inferiorly  and ongoing chest pain so he was transferred to [facility] and taken urgently to the catheterization lab.   The cardiac catheterization showed a 25% diagonal and a 25% RCA.  The RCA  also had a ruptured plaque.  He had inferior hypokinesis on his left  ventriculogram and it was felt that stent of the RCA was needed.   Dr. Samule Ohm placed a bare metal  stent to the RCA, reducing the stenosis from  25% to 0.  It stabilized the plaque.  He was enrolled in the TRITON study.  He is not to take Plavix but is to be on the TRITON study medication for now  and aspirin indefinitely.  He also had empiric Lipitor 80 added to his  medication regimen.   [name] [name] lipid profile showed dyslipidemia with an HDL of 31, LDL 73.  His  TSH was within normal limits but borderline low at 0.399.  His hemoglobin  A1c was 5.8.  He was counseled on smoking cessation.   [name] [name] had some pain in his groin and has significantly area of ecchymosis  but there was no hematoma or bruit.  He was seen by cardiac rehab and did  well.  He was evaluated by Dr. Dorethea Clan and considered stable for discharge on  December 16, 2004,with outpatient follow-up arranged.  DISCHARGE INSTRUCTIONS:  1.  His activity level is to include no driving for four days, no lifting      for three weeks.  2.  He is to stick to a low fat diet.  3.  He is to be followed by the research office as well as by [facility] in [place], [place].  He will see Jae Dire, P.A.-      C. on December 27, 2004, at 2 p.m.  4.  He is also to follow up with Dr. Juanetta Gosling for insomnia and other      medications to help continue smoking cessation (refused Wellbutrin).   DISCHARGE MEDICATIONS:  1.  TRITON study medications two pills daily.  2.  Aspirin 325 mg daily.  3.  Lipitor 80 mg daily.  4.  Norvasc 5 mg daily.  5.  Nitroglycerin sublingual p.r.n.  6.  Xanax 0.5 mg nightly p.r.n., 15 tablets no refills.      Theodore Demark, P.A. [facility]      Vida Roller, M.D.  Electronically Signed    RB/MEDQ  D:  12/16/2004  T:  12/17/2004  Job:  [id number]   cc:   Hilario Quarry, M.D.  Fax: [phone]   Oneal Deputy. Juanetta Gosling, M.D.  Fax: [phone]   Vida Roller, M.D.  Fax: [phone]

## 2010-08-09 NOTE — Op Note (Signed)
NAME:  [name] [name], [name] [name]                            ACCOUNT NO.:  [id number]   MEDICAL RECORD NO.:  [id number]                   PATIENT TYPE:  AMB   LOCATION:  DAY                                  FACILITY:  [facility]   PHYSICIAN:  Lionel December, M.D.                 DATE OF BIRTH:  07/15/1970   DATE OF PROCEDURE:  11/10/2003  DATE OF DISCHARGE:                                 OPERATIVE REPORT   PROCEDURE:  Total colonoscopy.   ENDOSCOPIST:  Lionel December, M.D.   INDICATIONS:  [name] [name] is a 40 year old Caucasian male with  intermittent/chronic hematochezia felt to be secondary to hemorrhoids, but  he recently had an episode where he passed a large amount of blood.  He is,  therefore, undergoing diagnostic colonoscopy.  The procedure and risks were  reviewed with the patient and informed consent was obtained. Family history  is negative for colorectal carcinoma.   PREOPERATIVE MEDICATIONS:  Demerol 25 mg IV and Versed 8 mg IV.   FINDINGS:  Procedure performed in endoscopy suite.  The patient's vital  signs and O2 saturation were monitored during the procedure and remained  stable.  The patient was placed in the left lateral recumbent position and  rectal examination was performed.  No abnormality noted on external or  digital exam.   Olympus videoscope was placed in the rectum and advanced under vision into  the sigmoid colon and beyond.  Preparation was satisfactory.  The scope was  passed to the cecum which was identified by intravenous and appendiceal  orifice.  A short segment of TI was also examined and was normal..  Pictures  were taken for the record.  As the scope was withdrawn the colonic mucosa  was carefully examined.  There was a small polyp at the proximal descending  colon which was ablated by a cold biopsy.  Another small polyp was at rectum  and this was also ablated by a cold biopsy.  No diverticula or other  abnormalities were noted.  Rectal mucosa was normal.   The scope  was retroflexed to examine the anorectal junction and moderate  sized hemorrhoids were noted below the dentate line.  The endoscope was  straightened and withdrawn.  The patient tolerated the procedure well.   FINAL DIAGNOSES:  1. Two small polyps that were ablated by a cold biopsy.  One from descending     colon and the other one from the rectum.  2. External hemorrhoids felt to the source of his intermittent hematochezia.   RECOMMENDATIONS:  1. High fiber diet.  2. I will be contacting the patient with the biopsy results and further     recommendations.      ___________________________________________  Lionel December, M.D.   NR/MEDQ  D:  11/10/2003  T:  11/10/2003  Job:  [id number]   cc:   Ramon Dredge L. Juanetta Gosling, M.D.  [address]  [place]  [place] [address]  Fax: [phone]

## 2010-08-09 NOTE — Cardiovascular Report (Signed)
[name] [name], [name] [name]                  ACCOUNT NO.:  [id number]   MEDICAL RECORD NO.:  [id number]          PATIENT TYPE:  INP   LOCATION:  3736                         FACILITY:  [facility]   PHYSICIAN:  Vida Roller, M.D.   DATE OF BIRTH:  1970-06-19   DATE OF PROCEDURE:  12/13/2004  DATE OF DISCHARGE:                              CARDIAC CATHETERIZATION   HISTORY OF PRESENT ILLNESS:  Mr. Space is a 40 year old man who has multiple  cardiac risk factors including ongoing tobacco abuse, who presented to [facility] with discomfort in his chest and abnormal electrocardiogram.  This discomfort persisted even with intravenous nitroglycerin, aspirin, and  heparin and he was taken emergently to the catheterization laboratory.   PROCEDURE:  1.  Left heart catheterization.  2.  Selective coronary angiography.  3.  Left ventriculography.   DESCRIPTION OF PROCEDURE:  After obtaining informed consent, the patient was  brought to the cardiac catheterization laboratory emergently. There, he was  prepped and draped in the usual sterile manner and localized technique was  obtained over the right groin using 1% Lidocaine without epinephrine. The  right femoral artery was cannulated using the modified Seldinger technique  with a 6 French 10 cm sheath and left heart catheterization was performed  using a 6 French Judkins left #4, a 6 French Judkins right #4, and a 6  French pigtail catheter. The pigtail catheter was used for left  ventriculography in the 30 degree RAO view. At the conclusion of the  procedure, the catheter was kept in place and the patient underwent  percutaneous re-vascularization under the care of Dr. Geralynn Rile.   RESULTS:  Aortic pressure 105/65 with a mean of 82 mmHg.   Left ventricular pressure 96/8 with an end-diastolic pressure of 21 mmHg.   CORONARY ANGIOGRAPHY:  The left main coronary artery was a large vessel,  which is angiographically normal.   The left  circumflex coronary artery is a moderate caliber vessel with two  obtuse margins and it is angiographically normal.   The left anterior descending coronary artery is a large trans-apical vessel.  There is a 25% stenosis in one of the diagonals.   The right coronary artery is a large dominant vessel. There was a ruptured  plaque seen in the mid portion of the vessel, prior to the crux. The  posterior descending coronary artery and the posterolateral branches have  TIMI 3 flow and appear to be angiographically unremarkable.   Left ventriculogram reveals a preserved left ventricular systolic function  with estimated ejection fraction of 55%. There is focal hypokinesis of the  inferior posterior wall.   ASSESSMENT:  1.  Acute non-ST segment elevation myocardial infarction.  2.  Ruptured plaque in the right coronary artery.  3.  Preserved left ventricular systolic function.   PLAN:  I have discussed this case with Dr. Geralynn Rile and consideration  will be made for percutaneous re-vascularization of this ruptured plaque.      Vida Roller, M.D.  Electronically Signed     JH/MEDQ  D:  12/13/2004  T:  12/16/2004  Job:  [id number]   cc:   Oneal Deputy. Juanetta Gosling, M.D.  Fax: [phone]

## 2010-08-09 NOTE — H&P (Signed)
NAME:  [name], [name]                              ACCOUNT NO.:  [id number]   MEDICAL RECORD NO.:  [id number]                   PATIENT TYPE:   LOCATION:                                       FACILITY:   PHYSICIAN:  Lionel December, M.D.                 DATE OF BIRTH:  04-05-1970   DATE OF ADMISSION:  10/31/2003  DATE OF DISCHARGE:                                HISTORY & PHYSICAL   CHIEF COMPLAINT:  Blood in stool.   PRIMARY CARE PHYSICIAN:  Self-referral.   HISTORY OF PRESENT ILLNESS:  Mr. [name] [name] is a 40 year old Caucasian gentleman  who presents today for further evaluation of hematochezia.  He states he has  seen intermittent small volume bright red blood per rectum in the past,  however, recently developed a moderate amount of bright red blood noted on  the toilet tissue.  He had noted it in conjunction with some diarrhea.  He  states he has three to four episodes of diarrhea per month, usually related  to consuming a significant amount of dairy products.  Otherwise, he tends to  have normal stool daily.  He denies any abdominal pain, nausea or vomiting,  heartburn.  He has gained about 20 pounds in the last two years which he  relates to dietary indiscretions.  He does complain of some urinary  frequency.  He wonders if he is having a prostate problem.   CURRENT MEDICATIONS:  None.   ALLERGIES:  No known drug allergies.   PAST MEDICAL HISTORY:  History of migraine headaches.   FAMILY HISTORY:  Mother has heart disease.  No family history of chronic GI  illnesses or colorectal cancer.   SOCIAL HISTORY:  He is single.  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  He smokes one pack of cigarettes daily.  He  consumes alcohol one or two days weekly, usually in the amount of three to  four beers each day.   REVIEW OF SYSTEMS:  Please see HPI for GI.  GENITOURINARY:  He complains of  urinary urgency, but no hesitancy.  CARDIOPULMONARY:  He denies any chest  pain or shortness of breath.   PHYSICAL EXAMINATION:  VITAL SIGNS:  Weight 212, height 6 feet.  Blood  pressure 100/64, pulse 60.  GENERAL:  A pleasant, well-developed, well-nourished, Caucasian gentleman in  no acute distress.  SKIN:  Warm and dry, no jaundice.  HEENT:  Conjunctivae are pink, sclerae nonicteric.  Oropharyngeal mucosa  moist and pink.  No lesions, erythema, or exudates.  NECK:  No lymphadenopathy, thyromegaly, carotid bruits.  CHEST:  Lungs are clear to auscultation.  CARDIAC:  Regular rate and rhythm, normal S1 and S2, no murmurs, rubs, or  gallops.  ABDOMEN:  Positive bowel sounds, soft nondistended.  He has mild suprapubic  tenderness to deep palpation.  No organomegaly or masses.  No rebound  tenderness or guarding.  EXTREMITIES:  No edema.   IMPRESSION:  The patient is a 40 year old gentleman with:  1. Hematochezia, small to moderate volume consisting of bright red blood.     This may be due to a benign anorectal source.  However, given several     episodes, we need to evaluate further via colonoscopy.  2. Urinary urgency and suprapubic abdominal pain on examination.  We will     check an urinalysis as initial diagnostic maneuver.   PLAN:  1. Colonoscopy.  2. Urinalysis with culture positive.     _____________________________________  ___________________________________________  Tana Coast, P.A.                      Lionel December, M.D.   LL/MEDQ  D:  10/31/2003  T:  10/31/2003  Job:  [id number]

## 2010-08-09 NOTE — H&P (Signed)
[name] [name], [name] [name]                  ACCOUNT NO.:  [id number]   MEDICAL RECORD NO.:  [id number]          PATIENT TYPE:  INP   LOCATION:  2919                         FACILITY:  [facility]   PHYSICIAN:  Vida Roller, M.D.   DATE OF BIRTH:  [birth year]   DATE OF ADMISSION:  12/13/2004  DATE OF DISCHARGE:                                HISTORY & PHYSICAL   SUMMARY OF HISTORY:  Mr. Pardini is a 40 year old white male who today at  approximately 2 p.m. 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  He described this as a sharp pressure.  Initially, the discomfort  was an 8 on a scale of 0 to 10, associated with nausea, shortness of breath,  and diaphoresis.  He sat down and became dizzy.  He went to [facility]  Emergency Room, where he received sublingual nitroglycerin and baby aspirin  which eased the discomfort down to a 2 to 3.  He was transported via  [facility] for further evaluation.  He has continued to have waxing and waning  chest discomfort.   PAST MEDICAL HISTORY:  No known drug allergies.   MEDICATIONS PRIOR TO ADMISSION:  Include Aleve.   MEDICATIONS THAT HE RECEIVED AT [place]:  Include aspirin, nitroglycerin,  heparin 5,000 units at 1,000 units drip.   HISTORY:  1.  Migraines.  2.  Colonoscopy, in August 2005, that showed colon polyps.  He denies any history of diabetes, hypertension, or thyroid disorder.   SOCIAL HISTORY:  He resides in [place] alone.  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  He smokes approximately a pack per day for the last  left 12 years.  He drinks a six-pack every weekend.  He denies drug use.  However, he is testing positive for marijuana.  He denies any herbal  medication, specific diet.   FAMILY HISTORY:  Notable for his mother alive with hypertension.  His  father's age and health status is unknown.  He has one sister.  Remote  family history is  positive for diabetes, peripheral vascular disease, and  coronary artery disease.   REVIEW OF SYSTEMS:  Notable for sweats, headache, dyspnea on exertion,  cough, arthralgias in his knees, GERD.   PHYSICAL EXAMINATION:  VITAL SIGNS:  Per Dr. Dorethea Clan, shows a blood pressure  127/76, pulse 54 and regular, respirations 16.  GENERAL:  A well-nourished, well-developed, white male in no apparent  distress.  HEENT:  Shows normocephalic, atraumatic.  PERRLA.  NECK:  Supple without thyromegaly, carotid bruits, or JVD.  HEART:  PMI is not displaced.  Regular rate and rhythm with a normal S1-S2.  He does have an S4.  I did not appreciate any murmurs, rubs, clicks, or  gallops.  CHEST:  Symmetrical excursion.  He has diffuse wheezing.  SKIN:  Integument is intact without rashes.  ABDOMEN:  Soft.  Bowel sounds present without organomegaly, masses, or  tenderness.  EXTREMITIES:  Negative cyanosis, clubbing, or edema.  He has 2+ distal  pulses bilaterally.  MUSCULOSKELETAL:  Does not show any joint deformity.  NEURO:  Alert and oriented x3. Cranial nerves II-XII grossly intact.   Chest x-ray at this time is pending.  EKG shows sinus brady with a rate of  57.  He has anterior T-wave inversion.  Old EKGs are not available for  comparison at this time.  H&H is 14.6 and 42.5, normal indices, platelets  232, WBCs 14.0.  PTT 24, PT 13.0.  Sodium 134, potassium 3.6, BUN 13,  creatinine 1.3, glucose 161.  All LFTs are within normal limits.  Two ER  markers at [facility] were within normal limits.   IMPRESSION:  1.  Chest discomfort compatible with unstable angina and multiple cardiac      risk factors.  EKG changes show anterior T-wave inversion with ongoing      chest discomfort.  2.  Tobacco use.  3.  Polysubstance abuse, however, he denies cocaine or methamphetamines.   DISPOSITION:  1.  Mr. Enyeart will be taken emergently to the catheterization lab.  2.  We will continue heparin, aspirin,  and start a 2B3A inhibitor.  3.  He will need a smoking cessation consult.  4.  We will obtain a urine drug screen as well as fasting lipids in the      morning.  5.  Further medication adjustment and recommendations will be pending future      findings.      Joellyn Rued, P.A. [facility]      Vida Roller, M.D.  Electronically Signed    EW/MEDQ  D:  12/13/2004  T:  12/13/2004  Job:  [id number]   cc:   Lionel December, M.D.  [address]  [place] [address]   Ramon Dredge L. Juanetta Gosling, M.D.  Fax: [phone]   Vida Roller, M.D.  Fax: [phone]

## 2014-03-26 ENCOUNTER — Inpatient Hospital Stay (HOSPITAL_COMMUNITY)
Admission: EM | Admit: 2014-03-26 | Discharge: 2014-04-04 | DRG: 011 | Disposition: A | Discharge status: Home / Self Care | Payer: 59 | Attending: Otolaryngology | Admitting: Otolaryngology

## 2014-03-26 ENCOUNTER — Emergency Department (HOSPITAL_COMMUNITY): Payer: 59

## 2014-03-26 ENCOUNTER — Encounter (HOSPITAL_COMMUNITY): Payer: Self-pay | Admitting: Emergency Medicine

## 2014-03-26 DIAGNOSIS — R0602 Shortness of breath: Secondary | ICD-10-CM

## 2014-03-26 DIAGNOSIS — Z7982 Long term (current) use of aspirin: Secondary | ICD-10-CM

## 2014-03-26 DIAGNOSIS — J9601 Acute respiratory failure with hypoxia: Secondary | ICD-10-CM

## 2014-03-26 DIAGNOSIS — J9811 Atelectasis: Secondary | ICD-10-CM

## 2014-03-26 DIAGNOSIS — J0430 Supraglottitis, unspecified, without obstruction: Secondary | ICD-10-CM | POA: Diagnosis not present

## 2014-03-26 DIAGNOSIS — E871 Hypo-osmolality and hyponatremia: Secondary | ICD-10-CM | POA: Diagnosis present

## 2014-03-26 DIAGNOSIS — J0510 Acute epiglottitis without obstruction: Secondary | ICD-10-CM

## 2014-03-26 DIAGNOSIS — R1310 Dysphagia, unspecified: Secondary | ICD-10-CM

## 2014-03-26 DIAGNOSIS — I2510 Atherosclerotic heart disease of native coronary artery without angina pectoris: Secondary | ICD-10-CM | POA: Diagnosis present

## 2014-03-26 DIAGNOSIS — R0603 Acute respiratory distress: Secondary | ICD-10-CM

## 2014-03-26 DIAGNOSIS — G4733 Obstructive sleep apnea (adult) (pediatric): Secondary | ICD-10-CM | POA: Diagnosis present

## 2014-03-26 DIAGNOSIS — N179 Acute kidney failure, unspecified: Secondary | ICD-10-CM | POA: Diagnosis present

## 2014-03-26 DIAGNOSIS — N189 Chronic kidney disease, unspecified: Secondary | ICD-10-CM | POA: Diagnosis present

## 2014-03-26 DIAGNOSIS — J9600 Acute respiratory failure, unspecified whether with hypoxia or hypercapnia: Secondary | ICD-10-CM | POA: Diagnosis present

## 2014-03-26 DIAGNOSIS — Z4659 Encounter for fitting and adjustment of other gastrointestinal appliance and device: Secondary | ICD-10-CM

## 2014-03-26 DIAGNOSIS — I9589 Other hypotension: Secondary | ICD-10-CM | POA: Diagnosis present

## 2014-03-26 DIAGNOSIS — E861 Hypovolemia: Secondary | ICD-10-CM | POA: Diagnosis present

## 2014-03-26 DIAGNOSIS — I252 Old myocardial infarction: Secondary | ICD-10-CM

## 2014-03-26 DIAGNOSIS — R451 Restlessness and agitation: Secondary | ICD-10-CM | POA: Diagnosis not present

## 2014-03-26 HISTORY — DX: Obstructive sleep apnea (adult) (pediatric): G47.33

## 2014-03-26 HISTORY — DX: Acute myocardial infarction, unspecified: I21.9

## 2014-03-26 HISTORY — DX: Atherosclerotic heart disease of native coronary artery without angina pectoris: I25.10

## 2014-03-26 LAB — BASIC METABOLIC PANEL
Anion gap: 11 (ref 5–15)
BUN: 18 mg/dL (ref 6–23)
CO2: 18 mmol/L — AB (ref 19–32)
CREATININE: 1.42 mg/dL — AB (ref 0.50–1.35)
Calcium: 9.3 mg/dL (ref 8.4–10.5)
Chloride: 104 mEq/L (ref 96–112)
GFR calc Af Amer: 68 mL/min — ABNORMAL LOW (ref >90)
GFR, EST NON AFRICAN AMERICAN: 58 mL/min — AB (ref >90)
Glucose, Bld: 115 mg/dL — ABNORMAL HIGH (ref 70–99)
POTASSIUM: 3.5 mmol/L (ref 3.5–5.1)
Sodium: 133 mmol/L — ABNORMAL LOW (ref 135–145)

## 2014-03-26 LAB — CBC WITH DIFFERENTIAL/PLATELET
BASOS ABS: 0 10*3/uL (ref 0.0–0.1)
BASOS PCT: 0 % (ref 0–1)
Eosinophils Absolute: 0 10*3/uL (ref 0.0–0.7)
Eosinophils Relative: 0 % (ref 0–5)
HCT: 41.9 % (ref 39.0–52.0)
Hemoglobin: 14.1 g/dL (ref 13.0–17.0)
LYMPHS PCT: 8 % — AB (ref 12–46)
Lymphs Abs: 1.7 10*3/uL (ref 0.7–4.0)
MCH: 31.5 pg (ref 26.0–34.0)
MCHC: 33.7 g/dL (ref 30.0–36.0)
MCV: 93.5 fL (ref 78.0–100.0)
Monocytes Absolute: 1.3 10*3/uL — ABNORMAL HIGH (ref 0.1–1.0)
Monocytes Relative: 6 % (ref 3–12)
Neutro Abs: 17.3 10*3/uL — ABNORMAL HIGH (ref 1.7–7.7)
Neutrophils Relative %: 86 % — ABNORMAL HIGH (ref 43–77)
Platelets: 192 10*3/uL (ref 150–400)
RBC: 4.48 MIL/uL (ref 4.22–5.81)
RDW: 12.9 % (ref 11.5–15.5)
WBC: 20.3 10*3/uL — AB (ref 4.0–10.5)

## 2014-03-26 MED ORDER — ONDANSETRON HCL 4 MG/2ML IJ SOLN
INTRAMUSCULAR | Status: AC
  Administered 2014-03-26: 4 mg via INTRAVENOUS
  Filled 2014-03-26: qty 2

## 2014-03-26 MED ORDER — FAMOTIDINE IN NACL 20-0.9 MG/50ML-% IV SOLN
20.0000 mg | Freq: Once | INTRAVENOUS | Status: AC
  Administered 2014-03-26: 20 mg via INTRAVENOUS
  Filled 2014-03-26: qty 50

## 2014-03-26 MED ORDER — MORPHINE SULFATE 4 MG/ML IJ SOLN
INTRAMUSCULAR | Status: AC
  Administered 2014-03-26: 4 mg via INTRAVENOUS
  Filled 2014-03-26: qty 1

## 2014-03-26 MED ORDER — ONDANSETRON HCL 4 MG/2ML IJ SOLN
4.0000 mg | Freq: Once | INTRAMUSCULAR | Status: AC
  Administered 2014-03-26: 4 mg via INTRAVENOUS

## 2014-03-26 MED ORDER — MORPHINE SULFATE 4 MG/ML IJ SOLN
4.0000 mg | Freq: Once | INTRAMUSCULAR | Status: AC
  Administered 2014-03-26: 4 mg via INTRAVENOUS

## 2014-03-26 MED ORDER — DEXTROSE 5 % IV SOLN
2.0000 g | Freq: Once | INTRAVENOUS | Status: AC
  Administered 2014-03-26: 2 g via INTRAVENOUS
  Filled 2014-03-26: qty 2

## 2014-03-26 MED ORDER — DIPHENHYDRAMINE HCL 50 MG/ML IJ SOLN
25.0000 mg | Freq: Once | INTRAMUSCULAR | Status: AC
  Administered 2014-03-26: 25 mg via INTRAVENOUS
  Filled 2014-03-26: qty 1

## 2014-03-26 MED ORDER — LORAZEPAM 2 MG/ML IJ SOLN
INTRAMUSCULAR | Status: AC
Start: 2014-03-26 — End: 2014-03-27
  Filled 2014-03-26: qty 1

## 2014-03-26 MED ORDER — LORAZEPAM 2 MG/ML IJ SOLN
0.5000 mg | Freq: Once | INTRAMUSCULAR | Status: AC
  Administered 2014-03-26: 0.5 mg via INTRAVENOUS

## 2014-03-26 MED ORDER — IPRATROPIUM-ALBUTEROL 0.5-2.5 (3) MG/3ML IN SOLN
3.0000 mL | Freq: Once | RESPIRATORY_TRACT | Status: AC
  Administered 2014-03-26: 3 mL via RESPIRATORY_TRACT
  Filled 2014-03-26: qty 3

## 2014-03-26 MED ORDER — METHYLPREDNISOLONE SODIUM SUCC 125 MG IJ SOLR
125.0000 mg | Freq: Once | INTRAMUSCULAR | Status: AC
  Administered 2014-03-26: 125 mg via INTRAVENOUS
  Filled 2014-03-26: qty 2

## 2014-03-26 NOTE — Progress Notes (Signed)
Patient appears to have upper airway swelling, sore throat,lungs clear , patient has trouble speaking.

## 2014-03-26 NOTE — ED Notes (Signed)
Much calmer s/p medications. O2 sats remain 100%. No longer spitting out saliva. Transported to [facility] ED via [facility]

## 2014-03-26 NOTE — ED Provider Notes (Signed)
CSN: [id number]     Arrival date & time 03/26/14  2142 History  This chart was scribed for [name] Melter, MD by [name] [name], ED Scribe. This patient was seen in room APA01/APA01 and the patient's care was started at 9:44 PM.    Chief Complaint  Patient presents with  . Respiratory Distress    The history is provided by the patient. No language interpreter was used.   HPI Comments: [name] [name] is a 44 y.o. male who presents to the Emergency Department complaining of new worsening sore throat onset 1 day prior. Pt presents with difficulty breathing. Pt rates the severity of his throat pain 10/10.   Denies Hx of bronchitis or asthma   Past Medical History  Diagnosis Date  . Acute MI    Past Surgical History  Procedure Laterality Date  . Cardiac surgery     No family history on file. History  Substance Use Topics  . Smoking status: Never Smoker   . Smokeless tobacco: Not on file  . Alcohol Use: Yes    Review of Systems  HENT: Positive for sore throat.   Respiratory: Positive for shortness of breath.   All other systems reviewed and are negative.    Allergies  Review of patient's allergies indicates no known allergies.  Home Medications   Prior to Admission medications   Medication Sig Start Date End Date Taking? Authorizing Provider  aspirin EC 81 MG tablet Take 81 mg by mouth daily.   Yes Historical Provider, MD  ibuprofen (ADVIL,MOTRIN) 200 MG tablet Take 800 mg by mouth every 6 (six) hours as needed for fever or mild pain.   Yes Historical Provider, MD  Pseudoeph-CPM-DM-APAP (TYLENOL COLD) 30-2-15-325 MG TABS Take 2 tablets by mouth daily as needed (FOR FEVER/SYMPTOMS).   Yes Historical Provider, MD  simvastatin (ZOCOR) 20 MG tablet TAKE ONE TABLET BY MOUTH EVERY DAY AT BEDTIME Patient not taking: Reported on 03/26/2014 07/04/10   Ok Anis, NP   Triage Vitals: Pulse 115  Temp(Src) 100.3 F (37.9 C) (Oral)  Resp 24  Wt 220 lb (99.791 kg)  SpO2 100%    Physical Exam  Constitutional: He is oriented to person, place, and time. He appears well-developed and well-nourished.  HENT:  Head: Normocephalic and atraumatic.  Right Ear: External ear normal.  Left Ear: External ear normal.  Eyes: Conjunctivae and EOM are normal. Pupils are equal, round, and reactive to light.  Neck: Normal range of motion and phonation normal. Neck supple.  Cardiovascular: Normal rate, regular rhythm and normal heart sounds.   Pulmonary/Chest: Breath sounds normal. He has no wheezes. He exhibits no bony tenderness.  decreased air movement bilaterally, no stridor. Spitting clear saliva. No increased work of breathing.    Abdominal: Soft. There is no tenderness.  Musculoskeletal: Normal range of motion.  Neurological: He is alert and oriented to person, place, and time. No cranial nerve deficit or sensory deficit. He exhibits normal muscle tone. Coordination normal.  Skin: Skin is warm, dry and intact.  Psychiatric: He has a normal mood and affect. His behavior is normal. Judgment and thought content normal.  Nursing note and vitals reviewed.   ED Course  Procedures (including critical care time) DIAGNOSTIC STUDIES: Oxygen Saturation is 100% on RA, normal by my interpretation.    COORDINATION OF CARE: 9:53 PM-Discussed treatment plan with pt at bedside and pt agreed to plan.   Medications  morphine 4 MG/ML injection (not administered)  ondansetron (ZOFRAN) 4 MG/2ML  injection (not administered)  morphine 4 MG/ML injection 4 mg (not administered)  ondansetron (ZOFRAN) injection 4 mg (not administered)  cefTRIAXone (ROCEPHIN) 2 g in dextrose 5 % 50 mL IVPB (not administered)  ipratropium-albuterol (DUONEB) 0.5-2.5 (3) MG/3ML nebulizer solution 3 mL (3 mLs Nebulization Given 03/26/14 2159)  methylPREDNISolone sodium succinate (SOLU-MEDROL) 125 mg/2 mL injection 125 mg (125 mg Intravenous Given 03/26/14 2152)  famotidine (PEPCID) IVPB 20 mg (0 mg Intravenous Stopped  03/26/14 2221)  diphenhydrAMINE (BENADRYL) injection 25 mg (25 mg Intravenous Given 03/26/14 2152)    Patient Vitals for the past 24 hrs:  BP Temp Temp src Pulse Resp SpO2 Weight  03/26/14 2203 - - - - - 100 % -  03/26/14 2200 128/83 mmHg - - 94 15 100 % -  03/26/14 2148 - 100.3 F (37.9 C) Oral 115 24 100 % 220 lb (99.791 kg)    10:05 PM Reevaluation with update and discussion. After initial assessment and treatment, an updated evaluation reveals he is more comfortable now, he is controlling his oral secretions better.  His tachypnea has improved.  Wife is here, and states that this problem began yesterday with a sore throat but then suddenly got worse tonight. [name] [name] L   11:02 PM Reevaluation with update and discussion. After initial assessment and treatment, an updated evaluation reveals he continues to complain of a burning sensation in his throat.  His voice sounds raspy, and is higher pitched, according to his wife.  Continued absence of stridor. [name] [name] L   Initial call to ENT placed at 22:59.  As of 2344, the physician and has not returned the page.  Therefore, he will be sent to a higher level of care, at [facility] hospital, for disposition and treatment.  The risk of keeping the patient here without a higher level of care, is too great to wait longer for a return call.  The patient will be sent by EMS, to [facility] hospital  CRITICAL CARE Performed by: [name] [name] Total critical care time: 50 minutes Critical care time was exclusive of separately billable procedures and treating other patients. Critical care was necessary to treat or prevent imminent or life-threatening deterioration. Critical care was time spent personally by me on the following activities: development of treatment plan with patient and/or surrogate as well as nursing, discussions with consultants, evaluation of patient's response to treatment, examination of patient, obtaining history from patient or surrogate,  ordering and performing treatments and interventions, ordering and review of laboratory studies, ordering and review of radiographic studies, pulse oximetry and re-evaluation of patient's condition.   Labs Review Labs Reviewed  CBC WITH DIFFERENTIAL - Abnormal; Notable for the following:    WBC 20.3 (*)    Neutrophils Relative % 86 (*)    Neutro Abs 17.3 (*)    Lymphocytes Relative 8 (*)    Monocytes Absolute 1.3 (*)    All other components within normal limits  BASIC METABOLIC PANEL - Abnormal; Notable for the following:    Sodium 133 (*)    CO2 18 (*)    Glucose, Bld 115 (*)    Creatinine, Ser 1.42 (*)    GFR calc non Af Amer 58 (*)    GFR calc Af Amer 68 (*)    All other components within normal limits    Imaging Review Dg Neck Soft Tissue  03/26/2014   CLINICAL DATA:  Acute onset of respiratory distress, with sore throat and fever. Initial encounter.  EXAM: NECK SOFT TISSUES - 1+ VIEW  COMPARISON:  None.  FINDINGS: There is marked thickening of the epiglottis and diffuse surrounding soft tissue edema, raising concern for significant epiglottitis. There is mild associated dilatation of the hypopharynx. Prevertebral soft tissues are difficult to fully assess but appear grossly unremarkable.  No acute osseous abnormalities are seen.  IMPRESSION: Marked thickening of the epiglottis and diffuse surrounding soft tissue edema, compatible with significant epiglottitis. Mild associated dilatation of the hypopharynx.  These results were called by telephone at the time of interpretation on 03/26/2014 at 10:56 pm to Dr. Mancel Bale, who verbally acknowledged these results.   Electronically Signed   By: [name] M.D.   On: 03/26/2014 22:58   Dg Chest Portable 1 View  03/26/2014   CLINICAL DATA:  Acute onset of respiratory distress and sore throat. Fever. Initial encounter.  EXAM: PORTABLE CHEST - 1 VIEW  COMPARISON:  Chest radiograph and CTA of the chest performed 04/19/2009  FINDINGS: The  lungs are well-aerated. Vascular congestion is noted. Bilateral perihilar opacities could reflect mild pneumonia. There is no evidence of pleural effusion or pneumothorax.  The cardiomediastinal silhouette is within normal limits. No acute osseous abnormalities are seen.  IMPRESSION: Vascular congestion noted. Bilateral perihilar opacities raise concern for mild pneumonia, given the patient's symptoms.   Electronically Signed   By: [name] M.D.   On: 03/26/2014 22:31     EKG Interpretation   Date/Time:  Sunday March 26 2014 21:56:57 EST Ventricular Rate:  98 PR Interval:  130 QRS Duration: 92 QT Interval:  320 QTC Calculation: 408 R Axis:   67 Text Interpretation:  Sinus rhythm Borderline repolarization abnormality  since last tracing no significant change Confirmed by Effie Shy  MD, Constantin Hillery  [phone]) on 03/26/2014 10:05:03 PM      MDM   Final diagnoses:  Dysphagia  Epiglottitis    Epiglottitis, in adults.  Airway is not in immediate danger of loss.  He needs transfer to a facility where he can be managed by ENT specialty, with anesthesia and surgical backup.  He is being transferred emergently to a higher level of care.  Nursing Notes Reviewed/ Care Coordinated, and agree without changes. Applicable Imaging Reviewed.  Interpretation of Laboratory Data incorporated into ED treatment  Plan: Transfer for care  I personally performed the services described in this documentation, which was scribed in my presence. The recorded information has been reviewed and is accurate.        [name] Melter, MD 03/26/14 [phone]

## 2014-03-26 NOTE — ED Notes (Signed)
Patient had sore throat yesterday, tonight began having shortness of breath.  Patient drooling on arrival to room, tachypneic and anxious.

## 2014-03-27 ENCOUNTER — Encounter (HOSPITAL_COMMUNITY): Payer: Self-pay | Admitting: Adult Health

## 2014-03-27 ENCOUNTER — Inpatient Hospital Stay (HOSPITAL_COMMUNITY): Payer: 59

## 2014-03-27 ENCOUNTER — Encounter (HOSPITAL_COMMUNITY)
Admission: EM | Disposition: A | Discharge status: Home / Self Care | Payer: Self-pay | Source: Home / Self Care | Attending: Otolaryngology

## 2014-03-27 ENCOUNTER — Emergency Department (HOSPITAL_COMMUNITY): Payer: 59 | Admitting: Certified Registered"

## 2014-03-27 DIAGNOSIS — R1310 Dysphagia, unspecified: Secondary | ICD-10-CM | POA: Diagnosis present

## 2014-03-27 DIAGNOSIS — R0603 Acute respiratory distress: Secondary | ICD-10-CM | POA: Insufficient documentation

## 2014-03-27 DIAGNOSIS — E861 Hypovolemia: Secondary | ICD-10-CM | POA: Diagnosis present

## 2014-03-27 DIAGNOSIS — R0602 Shortness of breath: Secondary | ICD-10-CM | POA: Diagnosis present

## 2014-03-27 DIAGNOSIS — N179 Acute kidney failure, unspecified: Secondary | ICD-10-CM | POA: Diagnosis present

## 2014-03-27 DIAGNOSIS — Z7982 Long term (current) use of aspirin: Secondary | ICD-10-CM | POA: Diagnosis not present

## 2014-03-27 DIAGNOSIS — J0430 Supraglottitis, unspecified, without obstruction: Principal | ICD-10-CM

## 2014-03-27 DIAGNOSIS — I9589 Other hypotension: Secondary | ICD-10-CM | POA: Diagnosis present

## 2014-03-27 DIAGNOSIS — J0510 Acute epiglottitis without obstruction: Secondary | ICD-10-CM

## 2014-03-27 DIAGNOSIS — J80 Acute respiratory distress syndrome: Secondary | ICD-10-CM

## 2014-03-27 DIAGNOSIS — E871 Hypo-osmolality and hyponatremia: Secondary | ICD-10-CM | POA: Diagnosis present

## 2014-03-27 DIAGNOSIS — G4733 Obstructive sleep apnea (adult) (pediatric): Secondary | ICD-10-CM | POA: Diagnosis present

## 2014-03-27 DIAGNOSIS — J9600 Acute respiratory failure, unspecified whether with hypoxia or hypercapnia: Secondary | ICD-10-CM | POA: Diagnosis present

## 2014-03-27 DIAGNOSIS — N189 Chronic kidney disease, unspecified: Secondary | ICD-10-CM | POA: Diagnosis present

## 2014-03-27 DIAGNOSIS — R451 Restlessness and agitation: Secondary | ICD-10-CM | POA: Diagnosis not present

## 2014-03-27 DIAGNOSIS — I252 Old myocardial infarction: Secondary | ICD-10-CM | POA: Diagnosis not present

## 2014-03-27 DIAGNOSIS — I2510 Atherosclerotic heart disease of native coronary artery without angina pectoris: Secondary | ICD-10-CM | POA: Diagnosis present

## 2014-03-27 HISTORY — PX: TRACHEOSTOMY TUBE PLACEMENT: SHX814

## 2014-03-27 LAB — APTT: aPTT: 28 seconds (ref 24–37)

## 2014-03-27 LAB — CBC
HCT: 37.6 % — ABNORMAL LOW (ref 39.0–52.0)
HEMOGLOBIN: 12.4 g/dL — AB (ref 13.0–17.0)
MCH: 30.7 pg (ref 26.0–34.0)
MCHC: 33 g/dL (ref 30.0–36.0)
MCV: 93.1 fL (ref 78.0–100.0)
Platelets: 170 10*3/uL (ref 150–400)
RBC: 4.04 MIL/uL — ABNORMAL LOW (ref 4.22–5.81)
RDW: 13 % (ref 11.5–15.5)
WBC: 17.9 10*3/uL — AB (ref 4.0–10.5)

## 2014-03-27 LAB — BLOOD GAS, ARTERIAL
ACID-BASE DEFICIT: 4.7 mmol/L — AB (ref 0.0–2.0)
Bicarbonate: 19.8 mEq/L — ABNORMAL LOW (ref 20.0–24.0)
Drawn by: 41881
FIO2: 0.4 %
MECHVT: 650 mL
O2 SAT: 97.2 %
PATIENT TEMPERATURE: 98.6
PEEP: 5 cmH2O
PO2 ART: 104 mmHg — AB (ref 80.0–100.0)
RATE: 14 resp/min
TCO2: 20.9 mmol/L (ref 0–100)
pCO2 arterial: 36 mmHg (ref 35.0–45.0)
pH, Arterial: 7.359 (ref 7.350–7.450)

## 2014-03-27 LAB — GLUCOSE, CAPILLARY
GLUCOSE-CAPILLARY: 169 mg/dL — AB (ref 70–99)
GLUCOSE-CAPILLARY: 102 mg/dL — AB (ref 70–99)
Glucose-Capillary: 164 mg/dL — ABNORMAL HIGH (ref 70–99)
Glucose-Capillary: 141 mg/dL — ABNORMAL HIGH (ref 70–99)
Glucose-Capillary: 131 mg/dL — ABNORMAL HIGH (ref 70–99)

## 2014-03-27 LAB — BASIC METABOLIC PANEL
ANION GAP: 8 (ref 5–15)
BUN: 13 mg/dL (ref 6–23)
CALCIUM: 8.5 mg/dL (ref 8.4–10.5)
CHLORIDE: 108 meq/L (ref 96–112)
CO2: 21 mmol/L (ref 19–32)
CREATININE: 1.39 mg/dL — AB (ref 0.50–1.35)
GFR calc Af Amer: 69 mL/min — ABNORMAL LOW (ref >90)
GFR calc non Af Amer: 60 mL/min — ABNORMAL LOW (ref >90)
Glucose, Bld: 174 mg/dL — ABNORMAL HIGH (ref 70–99)
POTASSIUM: 4 mmol/L (ref 3.5–5.1)
SODIUM: 137 mmol/L (ref 135–145)

## 2014-03-27 LAB — PROTIME-INR
INR: 1.22 (ref 0.00–1.49)
Prothrombin Time: 15.5 seconds — ABNORMAL HIGH (ref 11.6–15.2)

## 2014-03-27 LAB — CREATININE, SERUM
Creatinine, Ser: 1.43 mg/dL — ABNORMAL HIGH (ref 0.50–1.35)
GFR, EST AFRICAN AMERICAN: 67 mL/min — AB (ref >90)
GFR, EST NON AFRICAN AMERICAN: 58 mL/min — AB (ref >90)

## 2014-03-27 LAB — TROPONIN I: Troponin I: <0.03 ng/mL (ref <0.031)

## 2014-03-27 LAB — MRSA PCR SCREENING: MRSA by PCR: NEGATIVE

## 2014-03-27 LAB — OSMOLALITY, URINE: Osmolality, Ur: 291 mOsm/kg — ABNORMAL LOW (ref 390–1090)

## 2014-03-27 LAB — SODIUM, URINE, RANDOM: Sodium, Ur: <10 mmol/L

## 2014-03-27 LAB — OSMOLALITY: OSMOLALITY: 292 mosm/kg (ref 275–300)

## 2014-03-27 LAB — LACTIC ACID, PLASMA
LACTIC ACID, VENOUS: 2.1 mmol/L (ref 0.5–2.2)
Lactic Acid, Venous: 1.2 mmol/L (ref 0.5–2.2)

## 2014-03-27 SURGERY — CREATION, TRACHEOSTOMY
Anesthesia: Choice | Site: Throat

## 2014-03-27 MED ORDER — MIDAZOLAM BOLUS VIA INFUSION
1.0000 mg | INTRAVENOUS | Status: DC | PRN
  Administered 2014-03-27 (×4): 1 mg via INTRAVENOUS
  Filled 2014-03-27 (×5): qty 2

## 2014-03-27 MED ORDER — ALBUTEROL SULFATE (2.5 MG/3ML) 0.083% IN NEBU: 2.5000 mg | INHALATION_SOLUTION | RESPIRATORY_TRACT | Status: DC | PRN

## 2014-03-27 MED ORDER — LACTATED RINGERS IV SOLN
INTRAVENOUS | Status: DC | PRN
  Administered 2014-03-27: 01:00:00 via INTRAVENOUS

## 2014-03-27 MED ORDER — VITAL HIGH PROTEIN PO LIQD
1000.0000 mL | ORAL | Status: DC
  Filled 2014-03-27 (×3): qty 1000

## 2014-03-27 MED ORDER — MIDAZOLAM HCL 2 MG/2ML IJ SOLN
2.0000 mg | INTRAMUSCULAR | Status: DC | PRN
  Administered 2014-03-28 – 2014-03-29 (×5): 2 mg via INTRAVENOUS
  Filled 2014-03-27 (×5): qty 2

## 2014-03-27 MED ORDER — SODIUM CHLORIDE 0.9 % IJ SOLN
INTRAMUSCULAR | Status: AC
  Filled 2014-03-27: qty 10

## 2014-03-27 MED ORDER — PANTOPRAZOLE SODIUM 40 MG IV SOLR
40.0000 mg | INTRAVENOUS | Status: DC
  Administered 2014-03-27 – 2014-03-28 (×2): 40 mg via INTRAVENOUS
  Filled 2014-03-27 (×3): qty 40

## 2014-03-27 MED ORDER — LIDOCAINE HCL 2 % EX GEL
CUTANEOUS | Status: AC
  Filled 2014-03-27: qty 20

## 2014-03-27 MED ORDER — ASPIRIN 81 MG PO CHEW
81.0000 mg | CHEWABLE_TABLET | Freq: Every day | ORAL | Status: DC
  Administered 2014-03-27 – 2014-04-04 (×6): 81 mg
  Filled 2014-03-27 (×7): qty 1

## 2014-03-27 MED ORDER — ACETAMINOPHEN 10 MG/ML IV SOLN
1000.0000 mg | Freq: Four times a day (QID) | INTRAVENOUS | Status: DC
  Administered 2014-03-27: 1000 mg via INTRAVENOUS
  Filled 2014-03-27 (×3): qty 100

## 2014-03-27 MED ORDER — DEXMEDETOMIDINE HCL IN NACL 200 MCG/50ML IV SOLN
INTRAVENOUS | Status: AC
  Filled 2014-03-27: qty 50

## 2014-03-27 MED ORDER — SODIUM CHLORIDE 0.9 % IV SOLN
INTRAVENOUS | Status: DC
  Administered 2014-03-27 – 2014-03-31 (×5): via INTRAVENOUS

## 2014-03-27 MED ORDER — MIDAZOLAM HCL 2 MG/2ML IJ SOLN
INTRAMUSCULAR | Status: AC
  Filled 2014-03-27: qty 2

## 2014-03-27 MED ORDER — OXYMETAZOLINE HCL 0.05 % NA SOLN
NASAL | Status: AC
  Filled 2014-03-27: qty 15

## 2014-03-27 MED ORDER — SUFENTANIL CITRATE 50 MCG/ML IV SOLN
INTRAVENOUS | Status: AC
  Filled 2014-03-27: qty 1

## 2014-03-27 MED ORDER — SUFENTANIL CITRATE 50 MCG/ML IV SOLN
INTRAVENOUS | Status: DC | PRN
  Administered 2014-03-27: 10 ug via INTRAVENOUS

## 2014-03-27 MED ORDER — VITAL HIGH PROTEIN PO LIQD
1000.0000 mL | ORAL | Status: DC
  Administered 2014-03-28 – 2014-03-31 (×6): 1000 mL
  Filled 2014-03-27 (×6): qty 1000

## 2014-03-27 MED ORDER — MORPHINE SULFATE 4 MG/ML IJ SOLN
4.0000 mg | Freq: Once | INTRAMUSCULAR | Status: AC
  Administered 2014-03-27: 2 mg via INTRAVENOUS

## 2014-03-27 MED ORDER — SODIUM CHLORIDE 0.9 % IV SOLN
0.0000 mg/h | INTRAVENOUS | Status: DC
  Administered 2014-03-27: 3 mg/h via INTRAVENOUS
  Filled 2014-03-27: qty 10

## 2014-03-27 MED ORDER — OXYMETAZOLINE HCL 0.05 % NA SOLN
NASAL | Status: DC | PRN
  Administered 2014-03-27: 1 via NASAL

## 2014-03-27 MED ORDER — CHLORHEXIDINE GLUCONATE 0.12 % MT SOLN
15.0000 mL | Freq: Two times a day (BID) | OROMUCOSAL | Status: DC
  Administered 2014-03-27 – 2014-04-03 (×14): 15 mL via OROMUCOSAL
  Filled 2014-03-27 (×13): qty 15

## 2014-03-27 MED ORDER — SODIUM CHLORIDE 0.9 % IJ SOLN
3.0000 mL | Freq: Two times a day (BID) | INTRAMUSCULAR | Status: DC
  Administered 2014-03-27 – 2014-03-30 (×5): 3 mL via INTRAVENOUS

## 2014-03-27 MED ORDER — ENOXAPARIN SODIUM 40 MG/0.4ML ~~LOC~~ SOLN
40.0000 mg | SUBCUTANEOUS | Status: DC
  Administered 2014-03-28 – 2014-03-29 (×2): 40 mg via SUBCUTANEOUS
  Filled 2014-03-27 (×3): qty 0.4

## 2014-03-27 MED ORDER — FENTANYL CITRATE 0.05 MG/ML IJ SOLN
25.0000 ug | INTRAMUSCULAR | Status: DC | PRN
  Administered 2014-03-29 – 2014-03-30 (×2): 100 ug via INTRAVENOUS

## 2014-03-27 MED ORDER — CEFTRIAXONE SODIUM IN DEXTROSE 20 MG/ML IV SOLN
1.0000 g | Freq: Two times a day (BID) | INTRAVENOUS | Status: DC
  Administered 2014-03-27 – 2014-04-03 (×16): 1 g via INTRAVENOUS
  Filled 2014-03-27 (×18): qty 50

## 2014-03-27 MED ORDER — MIDAZOLAM HCL 2 MG/2ML IJ SOLN
2.0000 mg | Freq: Once | INTRAMUSCULAR | Status: AC
  Administered 2014-03-27: 2 mg via INTRAVENOUS

## 2014-03-27 MED ORDER — PROPOFOL INFUSION 10 MG/ML OPTIME
INTRAVENOUS | Status: DC | PRN
  Administered 2014-03-27: 50 ug/kg/min via INTRAVENOUS

## 2014-03-27 MED ORDER — PROPOFOL 10 MG/ML IV EMUL
5.0000 ug/kg/min | INTRAVENOUS | Status: DC
  Administered 2014-03-27: 40 ug/kg/min via INTRAVENOUS
  Administered 2014-03-27: 50 ug/kg/min via INTRAVENOUS
  Administered 2014-03-27: 30 ug/kg/min via INTRAVENOUS
  Filled 2014-03-27 (×3): qty 100

## 2014-03-27 MED ORDER — KCL IN DEXTROSE-NACL 20-5-0.45 MEQ/L-%-% IV SOLN
INTRAVENOUS | Status: DC
  Administered 2014-03-27: 03:00:00 via INTRAVENOUS
  Filled 2014-03-27 (×3): qty 1000

## 2014-03-27 MED ORDER — PROPOFOL 10 MG/ML IV BOLUS
INTRAVENOUS | Status: DC | PRN
  Administered 2014-03-27: 150 mg via INTRAVENOUS
  Administered 2014-03-27: 50 mg via INTRAVENOUS

## 2014-03-27 MED ORDER — SODIUM CHLORIDE 0.9 % IJ SOLN
3.0000 mL | INTRAMUSCULAR | Status: DC | PRN
  Administered 2014-03-27: 3 mL via INTRAVENOUS
  Filled 2014-03-27: qty 3

## 2014-03-27 MED ORDER — SODIUM CHLORIDE 0.9 % IV SOLN
INTRAVENOUS | Status: DC | PRN
  Administered 2014-03-27: 01:00:00 via INTRAVENOUS

## 2014-03-27 MED ORDER — MORPHINE SULFATE 2 MG/ML IJ SOLN
INTRAMUSCULAR | Status: AC
  Filled 2014-03-27: qty 2

## 2014-03-27 MED ORDER — LIDOCAINE HCL 2 % EX GEL
CUTANEOUS | Status: DC | PRN
  Administered 2014-03-27: 1 via INTRATRACHEAL

## 2014-03-27 MED ORDER — SODIUM CHLORIDE 0.9 % IV BOLUS (SEPSIS)
3000.0000 mL | Freq: Once | INTRAVENOUS | Status: AC
  Administered 2014-03-27: 3000 mL via INTRAVENOUS

## 2014-03-27 MED ORDER — SODIUM CHLORIDE 0.9 % IV SOLN
25.0000 ug/h | INTRAVENOUS | Status: DC
  Administered 2014-03-27 (×2): 400 ug/h via INTRAVENOUS
  Administered 2014-03-27: 25 ug/h via INTRAVENOUS
  Administered 2014-03-28 – 2014-03-31 (×10): 400 ug/h via INTRAVENOUS
  Filled 2014-03-27 (×15): qty 50

## 2014-03-27 MED ORDER — PROPOFOL 10 MG/ML IV EMUL
5.0000 ug/kg/min | INTRAVENOUS | Status: DC
  Administered 2014-03-27 (×2): 30 ug/kg/min via INTRAVENOUS
  Administered 2014-03-28: 50 ug/kg/min via INTRAVENOUS
  Administered 2014-03-28: 60 ug/kg/min via INTRAVENOUS
  Administered 2014-03-28: 20 ug/kg/min via INTRAVENOUS
  Administered 2014-03-28: 50 ug/kg/min via INTRAVENOUS
  Administered 2014-03-29 (×3): 60 ug/kg/min via INTRAVENOUS
  Administered 2014-03-29: 70 ug/kg/min via INTRAVENOUS
  Administered 2014-03-29 (×4): 60 ug/kg/min via INTRAVENOUS
  Administered 2014-03-29: 70 ug/kg/min via INTRAVENOUS
  Administered 2014-03-29: 60 ug/kg/min via INTRAVENOUS
  Administered 2014-03-30 – 2014-03-31 (×14): 70 ug/kg/min via INTRAVENOUS
  Filled 2014-03-27 (×32): qty 100

## 2014-03-27 MED ORDER — CETYLPYRIDINIUM CHLORIDE 0.05 % MT LIQD
7.0000 mL | Freq: Four times a day (QID) | OROMUCOSAL | Status: DC
  Administered 2014-03-27 – 2014-04-02 (×26): 7 mL via OROMUCOSAL

## 2014-03-27 MED ORDER — DEXMEDETOMIDINE HCL 200 MCG/2ML IV SOLN
INTRAVENOUS | Status: DC | PRN
  Administered 2014-03-27 (×2): 20 ug via INTRAVENOUS

## 2014-03-27 MED ORDER — MIDAZOLAM HCL 2 MG/2ML IJ SOLN
INTRAMUSCULAR | Status: DC | PRN
  Administered 2014-03-27 (×2): 1 mg via INTRAVENOUS

## 2014-03-27 SURGICAL SUPPLY — 39 items
BLADE SURG ROTATE 9660 (MISCELLANEOUS) IMPLANT
CANISTER SUCTION 2500CC (MISCELLANEOUS) ×3 IMPLANT
CLEANER TIP ELECTROSURG 2X2 (MISCELLANEOUS) ×3 IMPLANT
COVER SURGICAL LIGHT HANDLE (MISCELLANEOUS) ×3 IMPLANT
CRADLE DONUT ADULT HEAD (MISCELLANEOUS) IMPLANT
DECANTER SPIKE VIAL GLASS SM (MISCELLANEOUS) ×3 IMPLANT
ELECT COATED BLADE 2.86 ST (ELECTRODE) ×3 IMPLANT
ELECT REM PT RETURN 9FT ADLT (ELECTROSURGICAL) ×3
ELECTRODE REM PT RTRN 9FT ADLT (ELECTROSURGICAL) ×1 IMPLANT
GAUZE SPONGE 4X4 16PLY XRAY LF (GAUZE/BANDAGES/DRESSINGS) ×3 IMPLANT
GLOVE BIO SURGEON STRL SZ7.5 (GLOVE) ×3 IMPLANT
GOWN STRL REUS W/ TWL LRG LVL3 (GOWN DISPOSABLE) ×2 IMPLANT
GOWN STRL REUS W/TWL LRG LVL3 (GOWN DISPOSABLE) ×6
HOLDER TRACH TUBE VELCRO 19.5 (MISCELLANEOUS) ×3 IMPLANT
KIT BASIN OR (CUSTOM PROCEDURE TRAY) ×3 IMPLANT
KIT ROOM TURNOVER OR (KITS) ×3 IMPLANT
KIT SUCTION CATH 14FR (SUCTIONS) IMPLANT
NDL HYPO 25GX1X1/2 BEV (NEEDLE) IMPLANT
NEEDLE HYPO 25GX1X1/2 BEV (NEEDLE) IMPLANT
NS IRRIG 1000ML POUR BTL (IV SOLUTION) ×3 IMPLANT
PACK EENT II TURBAN DRAPE (CUSTOM PROCEDURE TRAY) ×3 IMPLANT
PAD ARMBOARD 7.5X6 YLW CONV (MISCELLANEOUS) ×6 IMPLANT
PENCIL BUTTON HOLSTER BLD 10FT (ELECTRODE) ×3 IMPLANT
SPONGE DRAIN TRACH 4X4 STRL 2S (GAUZE/BANDAGES/DRESSINGS) ×3 IMPLANT
SPONGE INTESTINAL PEANUT (DISPOSABLE) IMPLANT
SUT MON AB 3-0 SH 27 (SUTURE) ×3
SUT MON AB 3-0 SH27 (SUTURE) ×1 IMPLANT
SUT SILK 0 FSL (SUTURE) ×3 IMPLANT
SUT SILK 2 0 REEL (SUTURE) ×3 IMPLANT
SUT SILK 2 0 SH CR/8 (SUTURE) ×3 IMPLANT
SUT VIC AB 2-0 FS1 27 (SUTURE) IMPLANT
SYR 20ML ECCENTRIC (SYRINGE) ×3 IMPLANT
SYR BULB 3OZ (MISCELLANEOUS) ×3 IMPLANT
SYR CONTROL 10ML LL (SYRINGE) IMPLANT
TOWEL OR 17X24 6PK STRL BLUE (TOWEL DISPOSABLE) ×3 IMPLANT
TOWEL OR 17X26 10 PK STRL BLUE (TOWEL DISPOSABLE) ×3 IMPLANT
TUBE CONNECTING 12'X1/4 (SUCTIONS) ×1
TUBE CONNECTING 12X1/4 (SUCTIONS) ×2 IMPLANT
WATER STERILE IRR 1000ML POUR (IV SOLUTION) ×3 IMPLANT

## 2014-03-27 NOTE — Brief Op Note (Signed)
03/26/2014 - 03/27/2014  1:49 AM  PATIENT:  [name] [name]  44 y.o. male  PRE-OPERATIVE DIAGNOSIS:  epiglottitis  POST-OPERATIVE DIAGNOSIS:  epiglottitis  PROCEDURE:  Procedure(s): Fiberoptic Intubation (N/A)  SURGEON:  Surgeon(s) and Role:    * Christia Reading, MD - Primary  PHYSICIAN ASSISTANT:   ASSISTANTS: none   ANESTHESIA:   IV sedation  EBL:  Total I/O In: 400 [I.V.:400] Out: -   BLOOD ADMINISTERED:none  DRAINS: none   LOCAL MEDICATIONS USED:  OTHER Topical lidocaine  SPECIMEN:  No Specimen  DISPOSITION OF SPECIMEN:  N/A  COUNTS:  YES  TOURNIQUET:  * No tourniquets in log *  DICTATION: .Other Dictation: Dictation Number [id number]  PLAN OF CARE: Admit to inpatient   PATIENT DISPOSITION:  PACU - hemodynamically stable.   Delay start of Pharmacological VTE agent (>24hrs) due to surgical blood loss or risk of bleeding: no

## 2014-03-27 NOTE — ED Notes (Signed)
Wasted  morphine with Normajean Glasgow, RN.

## 2014-03-27 NOTE — Op Note (Signed)
NAMEJAHAD, OLD                  ACCOUNT NO.:  [id number]  MEDICAL RECORD NO.:  [id number]  LOCATION:  [facility]                         FACILITY:  [facility]  PHYSICIAN:  Antony Contras, MD     DATE OF BIRTH:  November 12, 1970  DATE OF PROCEDURE:  03/27/2014 DATE OF DISCHARGE:                              OPERATIVE REPORT   PREOPERATIVE DIAGNOSIS:  Adult supraglottitis.  POSTOPERATIVE DIAGNOSIS:  Adult supraglottitis.  PROCEDURE:  Fiberoptic intubation.  SURGEON:  Excell Seltzer. [name], MD  ANESTHESIA:  Sedation.  COMPLICATIONS:  None.  INDICATION:  The patient is a 44 year old male who has a 1-day history of increasing throat pain associated with difficulty swallowing and difficulty breathing.  Plain x-ray of the neck suggested supraglottitis and a fiberoptic exam at the bedside in the emergency room confirmed this with concern about potential for airway obstruction if the problem worsens.  Thus, he presents to the operating room for surgical intubation.  FINDINGS:  The epiglottis was thickened and purpuric but the glottic airway was not edematous.  The endotracheal tube was successfully placed via nasotracheal approach using a fiberoptic bronchoscope.  However, upon trying to confirm placement, the endotracheal tube came up above the glottis and cannot be replaced easily with the fiberoptic scope.  At that point, a GlideScope was used with the patient in a stable situation and the endotracheal tube was able to be placed orotracheally.  DESCRIPTION OF PROCEDURE:  The patient was identified in the emergency department and moved to the operating room, placed in a beach chair position.  The left nasal passage was packed with lidocaine soaked gauze and the anesthesia team applied topical anesthesia to the throat.  The gauze was taken out of the left side of the nose and the nose was sprayed with Afrin spray.  Couple of different nasal trumpets were then placed in increasing size, coated in  lidocaine gel.  Once these were all completed, the bronchoscope with a 7.0 endotracheal tube placed on it, was then passed through the left nasal passage and down past the epiglottis and into the trachea.  The endotracheal tube was then advanced over the fiberoptic scope into the airway.  Upon backing out the scope, the tube became dislodged and the scope was readvanced down into the airway and the tube successfully advanced over the scope into the trachea and the scope was then backed out.  The patient was successfully ventilated.  The scope was repositioned down the tube after a few minutes to check positioning and while trying to identify the positioning, the tube again became dislodged.  At this time, the airway was not easily seen with a fiberoptic scope because of secretions.  The patient was mask ventilated with success and endotracheal tube was removed.  A GlideScope was then used by the anesthesia team to visualize the larynx and a 7.5 endotracheal tube was then placed through the mouth into the larynx with success and advanced into the airway.  Breath sounds were confirmed as was CO2 on the monitor.  The tube was secured to the face using tape and he was given sedation throughout the process and then moved to the recovery room  in stable condition.     Antony Contras, MD     DDB/MEDQ  D:  03/27/2014  T:  03/27/2014  Job:  [id number]

## 2014-03-27 NOTE — ED Notes (Signed)
Wasted  morphine with Woody,RN.

## 2014-03-27 NOTE — Progress Notes (Signed)
Verbal Order Per Dr Tyson Alias, Daily Leak test to be completed.   Positive cuff leak today

## 2014-03-27 NOTE — Anesthesia Preprocedure Evaluation (Signed)
Anesthesia Evaluation  Patient identified by MRN, date of birth, ID band Patient awake    Reviewed: Allergy & Precautions, H&P , NPO status   Airway Mallampati: II  TM Distance: >3 FB Neck ROM: Full    Dental  (+) Teeth Intact, Dental Advisory Given   Pulmonary  - rhonchi   + stridor     Cardiovascular Rhythm:Regular Rate:Tachycardia     Neuro/Psych    GI/Hepatic   Endo/Other    Renal/GU      Musculoskeletal   Abdominal   Peds  Hematology   Anesthesia Other Findings   Reproductive/Obstetrics                             Anesthesia Physical Anesthesia Plan  ASA: IV and emergent  Anesthesia Plan: General   Post-op Pain Management:    Induction: Intravenous  Airway Management Planned: Oral ETT  Additional Equipment:   Intra-op Plan:   Post-operative Plan:   Informed Consent: I have reviewed the patients History and Physical, chart, labs and discussed the procedure including the risks, benefits and alternatives for the proposed anesthesia with the patient or authorized representative who has indicated his/her understanding and acceptance.     Plan Discussed with: CRNA and Anesthesiologist  Anesthesia Plan Comments: (44 year old male with epiglottis at risk for airway obstruction, plan awake intubation in OR.)        Anesthesia Quick Evaluation

## 2014-03-27 NOTE — ED Notes (Signed)
Dr. Bates at bedside 

## 2014-03-27 NOTE — ED Provider Notes (Signed)
Patient seen in transfer from [facility].  He was seen and diagnosed with epiglottitis.  Patient improved initially there with Solu-Medrol, ceftriaxone pain medicine and Ativan.  EMS reports in route .  He had several coughing spells which seemed to exacerbate his pain.  Patient was stridor and worsening shortness of breath.  Prior to arrival case was discussed with anesthesia.  Both come to the emergency department.  At this time, anesthesia has seen the patient and plans to take him to the operating room.  Upon pt's arrival, d/w Dr [name].   Patient is able to speak, is maintaining his own airway at this time.  His oxygen saturation saturations are 100%.  He is complaining of pain to the back of his throat.  Patient has been explained his disease process and the need for emergent intubation.  Olivia Mackie, MD 03/27/14 [phone]

## 2014-03-27 NOTE — ED Notes (Signed)
Patient arrived to Trauma A with EMS.  Dr Norlene Campbell at bedside with respiratory.  Patient with extreme shortness of breath with coughing.  Patient coughing up mucus.

## 2014-03-27 NOTE — Progress Notes (Signed)
Utilization review completed. Dresden Ament, RN, BSN. 

## 2014-03-27 NOTE — Progress Notes (Addendum)
S: Remains intubated and sedated.  Has been getting agitated at times.  Sedation increased. O: Sedated.  Orotracheally intubated. A/P: Adult supraglottis s/p intubation in OR Doing well.  Continue IV antibiotics and airway protection via endotracheal intubation.  Plan to leave endotracheal tube in place at least a couple of days before considering extubation, if cuff leak is present.  Appreciate CCM assistance.  OK to start tube feedings at any time.

## 2014-03-27 NOTE — Progress Notes (Signed)
INITIAL NUTRITION ASSESSMENT  DOCUMENTATION CODES Per approved criteria  -Not Applicable   INTERVENTION: Initiate Vital High Protein @ 20 ml/hr via OGT and increase by 10 ml every 4 hours to goal rate of 70 ml/hr.   Tube feeding regimen provides 1680 kcal (71% of needs- 2318 kcals (98% of needs) with current propofol rate), 147 grams of protein, and 1284 ml of H2O.   NUTRITION DIAGNOSIS: Inadequate oral intake related to inability to eat as evidenced by NPO.   Goal: Pt will meet >90% of estimated nutritional needs  Monitor:  Respiratory status, TF tolerance/advancement, labs, weight changes, I/O's  Reason for Assessment: VDRF, Consult for TF initiation and management  44 y.o. male  Admitting Dx: <principal problem not specified>  44 year old male with sore throat that started yesterday and worsened by the evening making it difficult to swallow. Breathing became more difficult like his throat was closing so he went to the ER at [facility] where a plain x-ray demonstrated a thickened epiglottis. He was transferred to [facility] after receiving solu-medrol and Rocephin among other things. He reports feeling better now than he did at [facility]. Still, his throat feels narrowed.  ASSESSMENT: Pt s/p PROCEDURE: Procedure(s) on 03/27/14: Fiberoptic Intubation (N/A)  Patient is currently intubated on ventilator support. OGT in place.  MV: 9.5 L/min Temp (24hrs), Avg:99.2 F (37.3 C), Min:97 F (36.1 C), Max:101.2 F (38.4 C)  Propofol: 24 ml/hr (634 kcals)  Adult Tube Feeding Protocol initiated by MD. Noted orders for Vital High Protein at 40 ml/hr, which will provide 960 kcals, 84 grams protein, and 803 ml fluid (which meets 41% of minimum estimated kcal needs and 70% of minimum estimated protein needs).  No signs of muscle or fat depletion noted.  Labs reviewed. Creat: 1.39. Glucose: 174. ZOXW:960-454.   Height: Ht Readings from Last 1 Encounters:  03/27/14  (1.88 m)     Weight: Wt Readings from Last 1 Encounters:  03/27/14 220 lb 7.4 oz (100 kg)    Ideal Body Weight: 190#  % Ideal Body Weight: 116%  Wt Readings from Last 10 Encounters:  03/27/14 220 lb 7.4 oz (100 kg)    Usual Body Weight: 220#  % Usual Body Weight: 100%  BMI:  Body mass index is 28.29 kg/(m^2). Overweight  Estimated Nutritional Needs: Kcal: 2374.6 Protein: 135-145 grams Fluid: >2.0 L  Skin: Intact  Diet Order: Diet NPO time specified  EDUCATION NEEDS: -Education not appropriate at this time   Intake/Output Summary (Last 24 hours) at 03/27/14 0949 Last data filed at 03/27/14 0800  Gross per 24 hour  Intake 1846.96 ml  Output    765 ml  Net 1081.96 ml    Last BM: PTA  Labs:   Recent Labs Lab 03/26/14 2140 03/27/14 0318 03/27/14 0624  NA 133*  --  137  K 3.5  --  4.0  CL 104  --  108  CO2 18*  --  21  BUN 18  --  13  CREATININE 1.42* 1.43* 1.39*  CALCIUM 9.3  --  8.5  GLUCOSE 115*  --  174*    CBG (last 3)   Recent Labs  03/27/14 0343 03/27/14 0732  GLUCAP 169* 164*    Scheduled Meds: . antiseptic oral rinse  7 mL Mouth Rinse QID  . aspirin  81 mg Per Tube Daily  . cefTRIAXone (ROCEPHIN)  IV  1 g Intravenous Q12H  . chlorhexidine  15 mL Mouth Rinse BID  . [  START ON 03/28/2014] enoxaparin (LOVENOX) injection  40 mg Subcutaneous Q24H  . feeding supplement (VITAL HIGH PROTEIN)  1,000 mL Per Tube Q24H  . morphine      . pantoprazole (PROTONIX) IV  40 mg Intravenous Q24H    Continuous Infusions: . sodium chloride 100 mL/hr at 03/27/14 0753  . fentaNYL infusion INTRAVENOUS 300 mcg/hr (03/27/14 0800)  . propofol 45 mcg/kg/min (03/27/14 1610)    Past Medical History  Diagnosis Date  . Acute MI   . OSA (obstructive sleep apnea)   . CAD (coronary artery disease)     PTCA and bare metal stent to the RCA 2006    Past Surgical History  Procedure Laterality Date  . Cardiac surgery      Shynia Daleo A. Mayford Knife, RD, LDN, CDE Pager:  [phone] After hours Pager: [phone]

## 2014-03-27 NOTE — H&P (Signed)
[name] [name] is an 44 y.o. male.   Chief Complaint: Supraglottitis HPI: 44 year old male with sore throat that started yesterday and worsened by the evening making it difficult to swallow.  Breathing became more difficult like his throat was closing so he went to the ER at [facility] where a plain x-ray demonstrated a thickened epiglottis.  He was transferred to [facility] after receiving solu-medrol and Rocephin among other things.  He reports feeling better now than he did at [facility].  Still, his throat feels narrowed.  Past Medical History  Diagnosis Date  . Acute MI     Past Surgical History  Procedure Laterality Date  . Cardiac surgery      No family history on file. Social History:  reports that he has never smoked. He does not have any smokeless tobacco history on file. He reports that he drinks alcohol. He reports that he uses illicit drugs (Marijuana).  Allergies: No Known Allergies   (Not in a hospital admission)  Results for orders placed or performed during the hospital encounter of 03/26/14 (from the past 48 hour(s))  CBC with Differential     Status: Abnormal   Collection Time: 03/26/14  9:40 PM  Result Value Ref Range   WBC 20.3 (H) 4.0 - 10.5 K/uL   RBC 4.48 4.22 - 5.81 MIL/uL   Hemoglobin 14.1 13.0 - 17.0 g/dL   HCT 41.9 39.0 - 52.0 %   MCV 93.5 78.0 - 100.0 fL   MCH 31.5 26.0 - 34.0 pg   MCHC 33.7 30.0 - 36.0 g/dL   RDW 12.9 11.5 - 15.5 %   Platelets 192 150 - 400 K/uL   Neutrophils Relative % 86 (H) 43 - 77 %   Neutro Abs 17.3 (H) 1.7 - 7.7 K/uL   Lymphocytes Relative 8 (L) 12 - 46 %   Lymphs Abs 1.7 0.7 - 4.0 K/uL   Monocytes Relative 6 3 - 12 %   Monocytes Absolute 1.3 (H) 0.1 - 1.0 K/uL   Eosinophils Relative 0 0 - 5 %   Eosinophils Absolute 0.0 0.0 - 0.7 K/uL   Basophils Relative 0 0 - 1 %   Basophils Absolute 0.0 0.0 - 0.1 K/uL  Basic metabolic panel     Status: Abnormal   Collection Time: 03/26/14  9:40 PM  Result Value Ref Range   Sodium  133 (L) 135 - 145 mmol/L    Comment: Please note change in reference range.   Potassium 3.5 3.5 - 5.1 mmol/L    Comment: Please note change in reference range.   Chloride 104 96 - 112 mEq/L   CO2 18 (L) 19 - 32 mmol/L   Glucose, Bld 115 (H) 70 - 99 mg/dL   BUN 18 6 - 23 mg/dL   Creatinine, Ser 1.42 (H) 0.50 - 1.35 mg/dL   Calcium 9.3 8.4 - 10.5 mg/dL   GFR calc non Af Amer 58 (L) >90 mL/min   GFR calc Af Amer 68 (L) >90 mL/min    Comment: (NOTE) The eGFR has been calculated using the CKD EPI equation. This calculation has not been validated in all clinical situations. eGFR's persistently <90 mL/min signify possible Chronic Kidney Disease.    Anion gap 11 5 - 15   Dg Neck Soft Tissue  03/26/2014   CLINICAL DATA:  Acute onset of respiratory distress, with sore throat and fever. Initial encounter.  EXAM: NECK SOFT TISSUES - 1+ VIEW  COMPARISON:  None.  FINDINGS: There is marked thickening of the epiglottis and diffuse surrounding soft tissue edema, raising concern for significant epiglottitis. There is mild associated dilatation of the hypopharynx. Prevertebral soft tissues are difficult to fully assess but appear grossly unremarkable.  No acute osseous abnormalities are seen.  IMPRESSION: Marked thickening of the epiglottis and diffuse surrounding soft tissue edema, compatible with significant epiglottitis. Mild associated dilatation of the hypopharynx.  These results were called by telephone at the time of interpretation on 03/26/2014 at 10:56 pm to Dr. Daleen Bo, who verbally acknowledged these results.   Electronically Signed   By: Garald Balding M.D.   On: 03/26/2014 22:58   Dg Chest Portable 1 View  03/26/2014   CLINICAL DATA:  Acute onset of respiratory distress and sore throat. Fever. Initial encounter.  EXAM: PORTABLE CHEST - 1 VIEW  COMPARISON:  Chest radiograph and CTA of the chest performed 04/19/2009  FINDINGS: The lungs are well-aerated. Vascular congestion is noted. Bilateral  perihilar opacities could reflect mild pneumonia. There is no evidence of pleural effusion or pneumothorax.  The cardiomediastinal silhouette is within normal limits. No acute osseous abnormalities are seen.  IMPRESSION: Vascular congestion noted. Bilateral perihilar opacities raise concern for mild pneumonia, given the patient's symptoms.   Electronically Signed   By: Garald Balding M.D.   On: 03/26/2014 22:31    Review of Systems  HENT: Positive for sore throat.   Respiratory: Positive for stridor.   All other systems reviewed and are negative.   Blood pressure 129/61, pulse 103, temperature 101.2 F (38.4 C), temperature source Oral, resp. rate 23, weight 99.791 kg (220 lb), SpO2 99 %. Physical Exam  Constitutional: He is oriented to person, place, and time. He appears well-developed and well-nourished. He appears distressed (Moderate respiratory distress.).  HENT:  Head: Normocephalic and atraumatic.  Right Ear: External ear normal.  Left Ear: External ear normal.  Nose: Nose normal.  Mouth/Throat: Oropharynx is clear and moist.  Mild muffled hoarseness.  Eyes: Conjunctivae and EOM are normal. Pupils are equal, round, and reactive to light.  Neck: Normal range of motion. Neck supple.  Laryngeal tenderness.  Cardiovascular: Normal rate.   Respiratory: He is in respiratory distress (Mild inspiratory stridor with deep inspiration.).  Musculoskeletal: Normal range of motion.  Neurological: He is alert and oriented to person, place, and time. No cranial nerve deficit.  Skin: Skin is warm and dry.  Psychiatric: He has a normal mood and affect. His behavior is normal. Judgment and thought content normal.     Assessment/Plan Acute supraglottitis Fiberoptic exam was performed at the bedside.  See procedure note.  I discussed his case with Dr. Audelia Hives of Anesthesiology.  We agreed to proceed with fiberoptic transnasal intubation in the operating room with tracheostomy as the backup plan.   Risks, benefits, and alternatives were discussed with the patient and his wife, who agree to proceed.  He will be admitted to ICU after intubation for IV antibiotic therapy and extubation will be considered in a couple of days or when a cuff-leak develops.  Azalie Harbeck 03/27/2014, 12:57 AM

## 2014-03-27 NOTE — Procedures (Signed)
Surgeon: [name] Anesth: Topical with 4% lidocaine gel Compl: None Findings: Supraglottic larynx with marked edema, inflammation with purpuric appearance.  Glottis visible and mobile but view a bit limited by epiglottic inflammation. Description: After discussing risks, benefits, and alternatives, the fiberoptic laryngoscope was introduced via the left nasal passage coated with lidocaine gel.  The scope was used to examine the pharynx and larynx.  The scope was then removed and the patient was returned to nursing care in stable condition.

## 2014-03-27 NOTE — Progress Notes (Addendum)
Sinus Bradycardia 43- 52 and systolic blood pressure 82-84 systolic.  At 1100 began decreasing propofol slowly to see if sbp and heart rate would increase. At 1145 systolic pressure up to 88.and remained SB 43-46. Turned propofol off. At 1200, patient awakened and became very agitated and was trying to pull ETT. HR up into 60's and sbp increased to lower 90's. Propofol restarted at 30 mcg/kg/min. Dr. Tyson Alias paged at 1131 and 1156.  Dr. Tyson Alias returned call at 1217. Updated on patient's condition as above and orders received.

## 2014-03-27 NOTE — Transfer of Care (Signed)
Immediate Anesthesia Transfer of Care Note  Patient: [name] [name]  Procedure(s) Performed: Procedure(s): Fiberoptic Intubation (N/A)  Patient Location: ICU  Anesthesia Type:MAC and General  Level of Consciousness: sedated and Patient remains intubated per anesthesia plan  Airway & Oxygen Therapy: Patient remains intubated per anesthesia plan and Patient placed on Ventilator (see vital sign flow sheet for setting)  Post-op Assessment: Post -op Vital signs reviewed and stable and Report given to ICU RN  Post vital signs: Reviewed and stable  Complications: No apparent anesthesia complications

## 2014-03-27 NOTE — Progress Notes (Signed)
   03/27/14 1715  Vitals  Pulse Rate 85  ECG Heart Rate 83  Resp (!) 23  Oxygen Therapy  SpO2 98 %  Pre-WUA / WUA Start  [place] Agitation Sedation Scale (RASS) 4  RASS Goal -2  Pain Assessment  Pain Intervention(s) Medication (See eMAR);Repositioned;Emotional support (bolused with 2 mg total versed and increased drip)  Critical Care Pain Observation Tool (CPOT)  Facial Expression 2  Body Movements 2  Muscle Tension 2  Compliance with ventilator (intubated pts.) 2  Vocalization (extubated pts.) N/A  CPOT Total 8  Glasgow Coma Scale  Eye Opening 4  Modified Verbal Response (INTUBATED) 3  Best Motor Response 6 (intermittent.)  Glasgow Coma Scale Score 13  Patient very agitated, diaphoretic, pulling on restraints, trying to sit up and attempting to pull out ETT. It did get disconnected by removed. Attempting to hold patient in bed and prevent removal of tube. Fentanyl is maxed at 400 mcg/hr and Midazolam was at 3 mg. Increased to 6 mg/hr. Had Elink camera in and Dr. Kendrick Fries was able to visualize the agitation. Order received for propofol and will be discontinuing the midazolam. Also notified Dr. Kendrick Fries of eleveated CBGs 140-160's. To notify him if the go above 180 mg/dl.

## 2014-03-27 NOTE — ED Notes (Signed)
To OR at this time.

## 2014-03-27 NOTE — ED Notes (Signed)
Wasted 2mg morphine with Woody,RN. 

## 2014-03-27 NOTE — Consult Note (Signed)
PULMONARY / CRITICAL CARE MEDICINE   Name: [name] [name] MRN: [id number] DOB: [birth year]    ADMISSION DATE:  03/26/2014 CONSULTATION DATE:  03/27/14  REFERRING MD :  [name] [name]   CHIEF COMPLAINT:  Epiglottitis   INITIAL PRESENTATION: 44yo male with hx OSA, CAD presented 1/3 to [facility] with 1-2 day hx sore throat, difficulty swallowing and progressive SOB.  Tx to [name] with suspicion of epiglottitis and developed progressive SOB/stridor and difficulty swallowing.  Taken to OR by ENT [name] [name]) for laryngoscopy and intubation.  PCCM consulted post op for vent management.   STUDIES:    SIGNIFICANT EVENTS: 03/26/14>>> to OR, intubated, laryngoscopy>> marked edema, inflamed, purpuric   HISTORY OF PRESENT ILLNESS:   44yo male with hx CAD presented 1/3 to [name] [name] with 1-2 day hx sore throat, difficulty swallowing and progressive SOB.  Tx to [name] [name] with suspicion of epiglottitis and developed progressive SOB/stridor and difficulty swallowing.  Taken to OR by ENT [name] [name]) for laryngoscopy and intubation.  PCCM consulted post op for vent management.    PAST MEDICAL HISTORY :   has a past medical history of Acute MI; OSA (obstructive sleep apnea); and CAD (coronary artery disease).  has past surgical history that includes Cardiac surgery. Prior to Admission medications   Medication Sig Start Date End Date Taking? Authorizing Provider  aspirin EC 81 MG tablet Take 81 mg by mouth daily.   Yes Historical Provider, MD  ibuprofen (ADVIL,MOTRIN) 200 MG tablet Take 800 mg by mouth every 6 (six) hours as needed for fever or mild pain.   Yes Historical Provider, MD  Pseudoeph-CPM-DM-APAP (TYLENOL COLD) 30-2-15-325 MG TABS Take 2 tablets by mouth daily as needed (FOR FEVER/SYMPTOMS).   Yes Historical Provider, MD  simvastatin (ZOCOR) 20 MG tablet TAKE ONE TABLET BY MOUTH EVERY DAY AT BEDTIME Patient not taking: Reported on 03/26/2014 07/04/10   Ok Anis, NP   No Known Allergies  FAMILY HISTORY:   has no family status information on file.  SOCIAL HISTORY:  reports that he has never smoked. He does not have any smokeless tobacco history on file. He reports that he drinks alcohol. He reports that he uses illicit drugs (Marijuana).  REVIEW OF SYSTEMS:  Unable.   SUBJECTIVE: some agitation  VITAL SIGNS: Temp:  [100.3 F (37.9 C)-101.2 F (38.4 C)] 101.2 F (38.4 C) (01/04 0030) Pulse Rate:  [94-107] 96 (01/04 0045) Resp:  [14-24] 21 (01/04 0045) BP: (109-131)/(54-90) 115/77 mmHg (01/04 0045) SpO2:  [99 %-100 %] 100 % (01/04 0045) FiO2 (%):  [40 %] 40 % (01/04 0215) Weight:  [220 lb (99.791 kg)] 220 lb (99.791 kg) (01/03 2148) HEMODYNAMICS:   VENTILATOR SETTINGS: Vent Mode:  [-] PRVC FiO2 (%):  [40 %] 40 % Set Rate:  [14 bmp] 14 bmp Vt Set:  [500 mL] 500 mL PEEP:  [5 cmH20] 5 cmH20 Plateau Pressure:  [16 cmH20] 16 cmH20 INTAKE / OUTPUT:  Intake/Output Summary (Last 24 hours) at 03/27/14 0300 Last data filed at 03/27/14 0205  Gross per 24 hour  Intake    900 ml  Output      0 ml  Net    900 ml    PHYSICAL EXAMINATION: General:  wdwn male, NAD  Neuro:  Sedated post op, RASS -1, intermittent agitation  HEENT:  Mm moist, ETT  Cardiovascular:  s1s2 rrr Lungs:  resps even, non labored on vent, few scattered rhonchi  Abdomen:  Soft, +bs  Musculoskeletal:  Warm and dry, no  edema, diaphoretic   LABS:  CBC  Recent Labs Lab 03/26/14 2140  WBC 20.3*  HGB 14.1  HCT 41.9  PLT 192   Coag's No results for input(s): APTT, INR in the last 168 hours. BMET  Recent Labs Lab 03/26/14 2140  NA 133*  K 3.5  CL 104  CO2 18*  BUN 18  CREATININE 1.42*  GLUCOSE 115*   Electrolytes  Recent Labs Lab 03/26/14 2140  CALCIUM 9.3   Sepsis Markers No results for input(s): LATICACIDVEN, PROCALCITON, O2SATVEN in the last 168 hours. ABG No results for input(s): PHART, PCO2ART, PO2ART in the last 168 hours. Liver Enzymes No results for input(s): AST, ALT, ALKPHOS,  BILITOT, ALBUMIN in the last 168 hours. Cardiac Enzymes No results for input(s): TROPONINI, PROBNP in the last 168 hours. Glucose No results for input(s): GLUCAP in the last 168 hours.  Imaging Dg Neck Soft Tissue  03/26/2014   CLINICAL DATA:  Acute onset of respiratory distress, with sore throat and fever. Initial encounter.  EXAM: NECK SOFT TISSUES - 1+ VIEW  COMPARISON:  None.  FINDINGS: There is marked thickening of the epiglottis and diffuse surrounding soft tissue edema, raising concern for significant epiglottitis. There is mild associated dilatation of the hypopharynx. Prevertebral soft tissues are difficult to fully assess but appear grossly unremarkable.  No acute osseous abnormalities are seen.  IMPRESSION: Marked thickening of the epiglottis and diffuse surrounding soft tissue edema, compatible with significant epiglottitis. Mild associated dilatation of the hypopharynx.  These results were called by telephone at the time of interpretation on 03/26/2014 at 10:56 pm to Dr. Mancel Bale, who verbally acknowledged these results.   Electronically Signed   By: [name] [name] M.D.   On: 03/26/2014 22:58   Dg Chest Portable 1 View  03/26/2014   CLINICAL DATA:  Acute onset of respiratory distress and sore throat. Fever. Initial encounter.  EXAM: PORTABLE CHEST - 1 VIEW  COMPARISON:  Chest radiograph and CTA of the chest performed 04/19/2009  FINDINGS: The lungs are well-aerated. Vascular congestion is noted. Bilateral perihilar opacities could reflect mild pneumonia. There is no evidence of pleural effusion or pneumothorax.  The cardiomediastinal silhouette is within normal limits. No acute osseous abnormalities are seen.  IMPRESSION: Vascular congestion noted. Bilateral perihilar opacities raise concern for mild pneumonia, given the patient's symptoms.   Electronically Signed   By: [name] [name] M.D.   On: 03/26/2014 22:31     ASSESSMENT / PLAN:  PULMONARY OETT 1/3>>> Acute respiratory failure  - r/t airway compromise in setting epiglottitis- s/p ENT intubation  P:   7.0 ETT - difficult intubation in OR r/t significant edema  Vent management  F/u ABG reviewed, keep same MV F/u CXR for rt base hazziness PRN BD  Daily SBT/PS wean but NO extubation until ok with ENT If able to reduce sedation enough to wean? As agitation is an issue and cant afford self extubation risk Consider 24 hr total decadron ABX Daily leak testing  CARDIOVASCULAR Hx CAD  Hypotension - mild R/o cardiomyopathy R/o ischemia  P:  F/u EKG with t flip, SB = underlying cardiomyopathy? R/o ischemia Monitor BP on propofol, may need to use different rx  Asa  Resume home statin when taking PO's  Get trop echo Assess lactic acid, see renal  RENAL Acute on chronic renal insufficiency - baseline Scr ~1.3 Hyponatremia - mild  P:   Gentle volume  F/u chem  Lactic acid Send serum osm, urine na, osm Change off 1/2  NS  GASTROINTESTINAL No active issue  P:   PPI  TF start pending Dr [name] [name] assessment  HEMATOLOGIC dvt prevention  P:  F/u cbc  lovenox , follow crt Get coags  INFECTIOUS Epiglottitis  P:   Rocephin 1/3>>>  Low threshold addition vanc Lactic assessment  ENDOCRINE No active issue  P:   Monitor glucose on chem   NEUROLOGIC Sedated  P:   RASS goal: -2 Cont propofol, fentanyl gtt  Daily WUA  Needs addition versed prn   FAMILY  - Updates: no family available 1/4  - Inter-disciplinary family meet or Palliative Care meeting due by:  04/02/14    Dirk Dress, NP 03/27/2014  3:00 AM Pager: [phone] or [phone]) [phone]  STAFF NOTE: Cindi Carbon, MD FACP have personally reviewed patient's available data, including medical history, events of note, physical examination and test results as part of my evaluation. I have discussed with resident/NP and other care providers such as pharmacist, RN and RRT. In addition, I personally evaluated patient and elicited key  findings of: ABG reviewed, keep same MV, some agitation noted, needs leak testing, consider steroids further, dc 1/2 NS with Na 133, assess Na work up, low threshold addition vanc, leak testing, SBT if able , do not want self extubation so may not be able to raise RASS scoring, await ENT assessment to start TF The patient is critically ill with multiple organ systems failure and requires high complexity decision making for assessment and support, frequent evaluation and titration of therapies, application of advanced monitoring technologies and extensive interpretation of multiple databases.   Critical Care Time devoted to patient care services described in this note is 30 Minutes. This time reflects time of care of this signee: Rory Percy, MD FACP. This critical care time does not reflect procedure time, or teaching time or supervisory time of PA/NP/Med student/Med Resident etc but could involve care discussion time. Rest per NP/medical resident whose note is outlined above and that I agree with   Mcarthur Rossetti. Tyson Alias, MD, FACP Pgr: [phone] [facility] 03/27/2014 7:07 AM

## 2014-03-27 NOTE — Anesthesia Postprocedure Evaluation (Signed)
  Anesthesia Post-op Note  Patient: [name] [name]  Procedure(s) Performed: Procedure(s): Fiberoptic Intubation (N/A)  Patient Location: SICU  Anesthesia Type:General  Level of Consciousness: sedated and Patient remains intubated per anesthesia plan  Airway and Oxygen Therapy: Patient remains intubated per anesthesia plan and Patient placed on Ventilator (see vital sign flow sheet for setting)  Post-op Pain: none  Post-op Assessment: Post-op Vital signs reviewed, Patient's Cardiovascular Status Stable, Respiratory Function Stable, Patent Airway and Pain level controlled  Post-op Vital Signs: stable  Last Vitals:  Filed Vitals:   03/27/14 0045  BP: 115/77  Pulse: 96  Temp:   Resp: 21    Complications: No apparent anesthesia complications

## 2014-03-27 NOTE — Progress Notes (Signed)
eLink Physician-Brief Progress Note Patient Name: [name] [name] DOB: [birth year] MRN: [id number]   Date of Service  03/27/2014  HPI/Events of Note  Severe agitation, nearly self-extubated BP/HR reviewed, normal lactic acid, normal UOP Baseline HR low per family  eICU Interventions  Use propofol for sedation given airway      Intervention Category Major Interventions: Delirium, psychosis, severe agitation - evaluation and management  MCQUAID, DOUGLAS 03/27/2014, 5:22 PM

## 2014-03-28 ENCOUNTER — Inpatient Hospital Stay (HOSPITAL_COMMUNITY): Payer: 59

## 2014-03-28 ENCOUNTER — Other Ambulatory Visit: Payer: Self-pay | Admitting: Otolaryngology

## 2014-03-28 ENCOUNTER — Encounter (HOSPITAL_COMMUNITY): Payer: Self-pay | Admitting: Otolaryngology

## 2014-03-28 DIAGNOSIS — I517 Cardiomegaly: Secondary | ICD-10-CM

## 2014-03-28 LAB — COMPREHENSIVE METABOLIC PANEL
ALT: 12 U/L (ref 0–53)
AST: 15 U/L (ref 0–37)
Albumin: 2.8 g/dL — ABNORMAL LOW (ref 3.5–5.2)
Alkaline Phosphatase: 24 U/L — ABNORMAL LOW (ref 39–117)
Anion gap: 8 (ref 5–15)
BILIRUBIN TOTAL: 0.3 mg/dL (ref 0.3–1.2)
BUN: 15 mg/dL (ref 6–23)
CO2: 20 mmol/L (ref 19–32)
Calcium: 8.1 mg/dL — ABNORMAL LOW (ref 8.4–10.5)
Chloride: 114 mEq/L — ABNORMAL HIGH (ref 96–112)
Creatinine, Ser: 1.02 mg/dL (ref 0.50–1.35)
GFR, EST NON AFRICAN AMERICAN: 87 mL/min — AB (ref >90)
GLUCOSE: 110 mg/dL — AB (ref 70–99)
Potassium: 4.3 mmol/L (ref 3.5–5.1)
Sodium: 142 mmol/L (ref 135–145)
Total Protein: 5.6 g/dL — ABNORMAL LOW (ref 6.0–8.3)

## 2014-03-28 LAB — GLUCOSE, CAPILLARY
GLUCOSE-CAPILLARY: 104 mg/dL — AB (ref 70–99)
Glucose-Capillary: 101 mg/dL — ABNORMAL HIGH (ref 70–99)
Glucose-Capillary: 93 mg/dL (ref 70–99)
Glucose-Capillary: 95 mg/dL (ref 70–99)

## 2014-03-28 LAB — CBC WITH DIFFERENTIAL/PLATELET
BASOS ABS: 0 10*3/uL (ref 0.0–0.1)
Basophils Relative: 0 % (ref 0–1)
Eosinophils Absolute: 0 10*3/uL (ref 0.0–0.7)
Eosinophils Relative: 0 % (ref 0–5)
HEMATOCRIT: 35.3 % — AB (ref 39.0–52.0)
HEMOGLOBIN: 11.3 g/dL — AB (ref 13.0–17.0)
LYMPHS PCT: 12 % (ref 12–46)
Lymphs Abs: 2.1 10*3/uL (ref 0.7–4.0)
MCH: 30.3 pg (ref 26.0–34.0)
MCHC: 32 g/dL (ref 30.0–36.0)
MCV: 94.6 fL (ref 78.0–100.0)
Monocytes Absolute: 1.5 10*3/uL — ABNORMAL HIGH (ref 0.1–1.0)
Monocytes Relative: 9 % (ref 3–12)
Neutro Abs: 13.1 10*3/uL — ABNORMAL HIGH (ref 1.7–7.7)
Neutrophils Relative %: 79 % — ABNORMAL HIGH (ref 43–77)
Platelets: 154 10*3/uL (ref 150–400)
RBC: 3.73 MIL/uL — ABNORMAL LOW (ref 4.22–5.81)
RDW: 13.7 % (ref 11.5–15.5)
WBC: 16.6 10*3/uL — ABNORMAL HIGH (ref 4.0–10.5)

## 2014-03-28 MED ORDER — PANTOPRAZOLE SODIUM 40 MG PO PACK
40.0000 mg | PACK | ORAL | Status: DC
  Administered 2014-03-28 – 2014-03-31 (×3): 40 mg
  Filled 2014-03-28 (×4): qty 20

## 2014-03-28 MED ORDER — SIMVASTATIN 20 MG PO TABS
20.0000 mg | ORAL_TABLET | Freq: Every day | ORAL | Status: DC
Start: 2014-03-28 — End: 2014-03-28

## 2014-03-28 MED ORDER — HALOPERIDOL LACTATE 5 MG/ML IJ SOLN
1.0000 mg | INTRAMUSCULAR | Status: DC | PRN
  Administered 2014-03-28: 2 mg via INTRAVENOUS
  Administered 2014-03-29: 4 mg via INTRAVENOUS
  Filled 2014-03-28 (×3): qty 1

## 2014-03-28 MED ORDER — SIMVASTATIN 20 MG PO TABS
20.0000 mg | ORAL_TABLET | Freq: Every day | ORAL | Status: DC
  Administered 2014-03-28 – 2014-03-30 (×3): 20 mg
  Filled 2014-03-28 (×9): qty 1

## 2014-03-28 MED ORDER — SODIUM CHLORIDE 0.9 % IV BOLUS (SEPSIS)
1000.0000 mL | Freq: Once | INTRAVENOUS | Status: AC
  Administered 2014-03-28: 1000 mL via INTRAVENOUS

## 2014-03-28 NOTE — Consult Note (Signed)
PULMONARY / CRITICAL CARE MEDICINE   Name: [name] [name] Current MRN: [id number] DOB: 1970/11/17    ADMISSION DATE:  03/26/2014 CONSULTATION DATE:  03/27/14  REFERRING MD :  Jenne PaneBates   CHIEF COMPLAINT:  Epiglottitis   INITIAL PRESENTATION: 44 yo male from presented to [facility] with sore throat, difficulty swallowing, dyspnea, stridor from epiglottitis.  Had laryngoscopy in OR by ENT, and remained on vent afterward.  PCCM consulted post op for vent management.   STUDIES:  1/03 Laryngoscopy >> Supraglottic larynx with marked edema, inflammation with purpuric appearance  SIGNIFICANT EVENTS: 1/03 to OR, intubated  SUBJECTIVE:  Agitated overnight.  VITAL SIGNS: Temp:  [95.9 F (35.5 C)-98.5 F (36.9 C)] 95.9 F (35.5 C) (01/05 0716) Pulse Rate:  [40-85] 40 (01/05 0716) Resp:  [0-23] 14 (01/05 0716) BP: (82-119)/(53-75) 96/56 mmHg (01/05 0716) SpO2:  [97 %-100 %] 99 % (01/05 0716) FiO2 (%):  [40 %] 40 % (01/05 0716) Weight:  [222 lb 7.1 oz (100.9 kg)] 222 lb 7.1 oz (100.9 kg) (01/05 0430) VENTILATOR SETTINGS: Vent Mode:  [-] PRVC FiO2 (%):  [40 %] 40 % Set Rate:  [14 bmp] 14 bmp Vt Set:  [500 mL-650 mL] 650 mL PEEP:  [5 cmH20] 5 cmH20 Plateau Pressure:  [10 cmH20-20 cmH20] 20 cmH20 INTAKE / OUTPUT:  Intake/Output Summary (Last 24 hours) at 03/28/14 0910 Last data filed at 03/28/14 0700  Gross per 24 hour  Intake 6636.16 ml  Output   2210 ml  Net 4426.16 ml    PHYSICAL EXAMINATION: General: no distress Neuro: RASS -3 HEENT: ETT in place Cardiovascular: regular Lungs: scattered rhonchi Abdomen:  Soft, non tender Musculoskeletal: no edema  LABS:  CBC  Recent Labs Lab 03/26/14 2140 03/27/14 0318 03/28/14 0247  WBC 20.3* 17.9* 16.6*  HGB 14.1 12.4* 11.3*  HCT 41.9 37.6* 35.3*  PLT 192 170 154   Coag's  Recent Labs Lab 03/27/14 0817  APTT 28  INR 1.22   BMET  Recent Labs Lab 03/26/14 2140 03/27/14 0318 03/27/14 0624 03/28/14 0247  NA 133*  --  137 142  K 3.5   --  4.0 4.3  CL 104  --  108 114*  CO2 18*  --  21 20  BUN 18  --  13 15  CREATININE 1.42* 1.43* 1.39* 1.02  GLUCOSE 115*  --  174* 110*   Electrolytes  Recent Labs Lab 03/26/14 2140 03/27/14 0624 03/28/14 0247  CALCIUM 9.3 8.5 8.1*   Sepsis Markers  Recent Labs Lab 03/27/14 0817 03/27/14 1935  LATICACIDVEN 2.1 1.2   ABG  Recent Labs Lab 03/27/14 0339  PHART 7.359  PCO2ART 36.0  PO2ART 104.0*   Liver Enzymes  Recent Labs Lab 03/28/14 0247  AST 15  ALT 12  ALKPHOS 24*  BILITOT 0.3  ALBUMIN 2.8*   Cardiac Enzymes  Recent Labs Lab 03/27/14 0817  TROPONINI <0.03   Glucose  Recent Labs Lab 03/27/14 0732 03/27/14 1208 03/27/14 1621 03/27/14 1917 03/27/14 2334 03/28/14 0408  GLUCAP 164* 141* 131* 102* 104* 93    Imaging Dg Chest Port 1 View  03/27/2014   CLINICAL DATA:  Known acute epiglottitis. Endotracheal tube placement. Shortness of breath. Initial encounter.  EXAM: PORTABLE CHEST - 1 VIEW  COMPARISON:  Chest radiograph performed 03/26/2014  FINDINGS: The patient's endotracheal tube is seen ending 6 cm above the carina. Apparent opacification of the distal trachea reflects overlying osseous structures and patient rotation.  The lungs are relatively well-aerated. Pulmonary vascularity is at the  upper limits of normal. No pleural effusion or pneumothorax is seen.  The cardiomediastinal silhouette remains normal in size. No acute osseous abnormalities are identified. An enteric tube is noted ending overlying the body of the stomach.  IMPRESSION: 1. Endotracheal tube seen ending 6 cm above the carina. 2. No acute cardiopulmonary process seen.   Electronically Signed   By: [name] M.D.   On: 03/27/2014 02:44     ASSESSMENT / PLAN:  PULMONARY ETT 1/3>>> A: Acute respiratory failure 2nd to epiglottitis. Hx of OSA. P:   Can wean on pressure support as tolerated F/u CXR Tentative plan to have airway exam in OR on 1/07, and then decide about  extubation  CARDIOVASCULAR A: Hypotension 2nd to hypovolemia, sedation. Hx CAD, HLD.  P:  IV fluids Monitor hemodynamics F/u Echo Continue ASA, zocor  RENAL A: Acute kidney injury 2nd to volume depletion. P:   Monitor renal fx, urine outpt, electrolytes  GASTROINTESTINAL A: Nutrition. P:   Protonix TF while on vent  HEMATOLOGIC A: Leukocytosis. P:  F/u CBC Lovenox for DVT prevention  INFECTIOUS A: Epiglottitis  P:   Day 3 rocephin  ENDOCRINE A: No active issues. P:   Monitor glucose on chem   NEUROLOGIC A: Sedation. P:   RASS goal: -2 Cont propofol, fentanyl gtt  Daily WUA  Needs addition versed prn  Inter-disciplinary family meet or Palliative Care meeting due by:  04/02/14  SUMMARY: Continue ETT >> plan to examine airway in OR on 1/07.  Discussed plan with Dr. [name].  CC time 35 minutes.  Coralyn Helling, MD [facility] 03/28/2014, 9:19 AM Pager:  [phone] After 3pm call: [phone]

## 2014-03-28 NOTE — Progress Notes (Signed)
Wasted 20ml of Versed in the sink. Milly JakobNikki Potter, RN witnessed.  Domenica Failebekah Terriann Difonzo, RN 03/28/14  [phone]

## 2014-03-28 NOTE — Anesthesia Postprocedure Evaluation (Signed)
  Anesthesia Post-op Note  Patient: Delos HaringMarvin R Gantt  Procedure(s) Performed: Procedure(s): Fiberoptic Intubation (N/A)  Patient Location: SICU  Anesthesia Type:General  Level of Consciousness: sedated and Patient remains intubated per anesthesia plan  Airway and Oxygen Therapy: Patient remains intubated per anesthesia plan and Patient placed on Ventilator (see vital sign flow sheet for setting)  Post-op Pain: none  Post-op Assessment: Post-op Vital signs reviewed, Patient's Cardiovascular Status Stable, Respiratory Function Stable and Patent Airway  Post-op Vital Signs: stable  Last Vitals:  Filed Vitals:   03/28/14 1400  BP: 102/64  Pulse: 42  Temp:   Resp: 14    Complications: No apparent anesthesia complications

## 2014-03-28 NOTE — Progress Notes (Signed)
1 Day Post-Op  Subjective: Patient remains intubated.  Has required additional sedation at times.  Tube feeds started.  Objective: Vital signs in last 24 hours: Temp:  [97.9 F (36.6 C)-98.5 F (36.9 C)] 98.5 F (36.9 C) (01/05 0400) Pulse Rate:  [40-85] 40 (01/05 0716) Resp:  [0-23] 14 (01/05 0716) BP: (82-119)/(53-75) 96/56 mmHg (01/05 0716) SpO2:  [97 %-100 %] 99 % (01/05 0716) FiO2 (%):  [40 %] 40 % (01/05 0716) Weight:  [100.9 kg (222 lb 7.1 oz)] 100.9 kg (222 lb 7.1 oz) (01/05 0430)    Intake/Output from previous day: 01/04 0701 - 01/05 0700 In: 7006.9 [I.V.:3716.9; NG/GT:190; IV Piggyback:3100] Out: 2370 [Urine:2370] Intake/Output this shift:    General appearance: sedated, orally intubated Throat: exam limited by endotracheal tube  Lab Results:   Recent Labs  03/27/14 0318 03/28/14 0247  WBC 17.9* 16.6*  HGB 12.4* 11.3*  HCT 37.6* 35.3*  PLT 170 154   BMET  Recent Labs  03/27/14 0624 03/28/14 0247  NA 137 142  K 4.0 4.3  CL 108 114*  CO2 21 20  GLUCOSE 174* 110*  BUN 13 15  CREATININE 1.39* 1.02  CALCIUM 8.5 8.1*   PT/INR  Recent Labs  03/27/14 0817  LABPROT 15.5*  INR 1.22   ABG  Recent Labs  03/27/14 0339  PHART 7.359  HCO3 19.8*    Studies/Results: Dg Neck Soft Tissue  03/26/2014   CLINICAL DATA:  Acute onset of respiratory distress, with sore throat and fever. Initial encounter.  EXAM: NECK SOFT TISSUES - 1+ VIEW  COMPARISON:  None.  FINDINGS: There is marked thickening of the epiglottis and diffuse surrounding soft tissue edema, raising concern for significant epiglottitis. There is mild associated dilatation of the hypopharynx. Prevertebral soft tissues are difficult to fully assess but appear grossly unremarkable.  No acute osseous abnormalities are seen.  IMPRESSION: Marked thickening of the epiglottis and diffuse surrounding soft tissue edema, compatible with significant epiglottitis. Mild associated dilatation of the  hypopharynx.  These results were called by telephone at the time of interpretation on 03/26/2014 at 10:56 pm to Dr. Mancel BaleELLIOTT WENTZ, who verbally acknowledged these results.   Electronically Signed   By: Roanna RaiderJeffery  Chang M.D.   On: 03/26/2014 22:58   Dg Chest Port 1 View  03/28/2014   CLINICAL DATA:  Acute respiratory distress.  EXAM: PORTABLE CHEST - 1 VIEW  COMPARISON:  March 27, 2014.  FINDINGS: The heart size and mediastinal contours are within normal limits. Endotracheal tube is in grossly good position with distal tip 5 cm above the carina. Nasogastric tube is seen passing through the esophagus into the stomach. No pneumothorax or pleural effusion is noted. Both lungs are clear. The visualized skeletal structures are unremarkable.  IMPRESSION: Stable support apparatus. No acute cardiopulmonary abnormality seen.   Electronically Signed   By: Roque LiasJames  Green M.D.   On: 03/28/2014 07:35   Dg Chest Port 1 View  03/27/2014   CLINICAL DATA:  Known acute epiglottitis. Endotracheal tube placement. Shortness of breath. Initial encounter.  EXAM: PORTABLE CHEST - 1 VIEW  COMPARISON:  Chest radiograph performed 03/26/2014  FINDINGS: The patient's endotracheal tube is seen ending 6 cm above the carina. Apparent opacification of the distal trachea reflects overlying osseous structures and patient rotation.  The lungs are relatively well-aerated. Pulmonary vascularity is at the upper limits of normal. No pleural effusion or pneumothorax is seen.  The cardiomediastinal silhouette remains normal in size. No acute osseous abnormalities are identified. An  enteric tube is noted ending overlying the body of the stomach.  IMPRESSION: 1. Endotracheal tube seen ending 6 cm above the carina. 2. No acute cardiopulmonary process seen.   Electronically Signed   By: [name] M.D.   On: 03/27/2014 02:44   Dg Chest Portable 1 View  03/26/2014   CLINICAL DATA:  Acute onset of respiratory distress and sore throat. Fever. Initial  encounter.  EXAM: PORTABLE CHEST - 1 VIEW  COMPARISON:  Chest radiograph and CTA of the chest performed 04/19/2009  FINDINGS: The lungs are well-aerated. Vascular congestion is noted. Bilateral perihilar opacities could reflect mild pneumonia. There is no evidence of pleural effusion or pneumothorax.  The cardiomediastinal silhouette is within normal limits. No acute osseous abnormalities are seen.  IMPRESSION: Vascular congestion noted. Bilateral perihilar opacities raise concern for mild pneumonia, given the patient's symptoms.   Electronically Signed   By: [name] M.D.   On: 03/26/2014 22:31    Anti-infectives: Anti-infectives    Start     Dose/Rate Route Frequency Ordered Stop   03/27/14 1000  cefTRIAXone (ROCEPHIN) 1 g in dextrose 5 % 50 mL IVPB - Premix     1 g100 mL/hr over 30 Minutes Intravenous Every 12 hours 03/27/14 0210     03/26/14 2315  cefTRIAXone (ROCEPHIN) 2 g in dextrose 5 % 50 mL IVPB     2 g100 mL/hr over 30 Minutes Intravenous  Once 03/26/14 2302 03/26/14 2341      Assessment/Plan: Acute supraglottitis s/p Procedure(s): Fiberoptic Intubation (N/A) Continues with intubation and IV Rocephin.  Discussed his case with CCM.  With severity at intubation, we agreed to continue with antibioitics and intubation for two more days.  Will plan direct laryngoscopy in the operating room Thursday prior to potential extubation thereafter.  Appreciate CCM assistance and management of other medical concerns.  LOS: 2 days    Aitana Burry 03/28/2014

## 2014-03-28 NOTE — Progress Notes (Signed)
  Echocardiogram 2D Echocardiogram has been performed.  [name] [name], [name] [name] 03/28/2014, 11:08 AM

## 2014-03-28 NOTE — Clinical Documentation Improvement (Signed)
Presents with Supraglottitis/Epiglottitis; intubated in OR and sedated.    Patient with abnormal lab values. Cr's ranging from 1.42 on admission to 1.02 with this morning's results  This is a 0.40 increase over the last 48 hours  Please provide a diagnosis associated with the above data and document findings in next progress note and include in discharge summary if applicable.  Acute Renal Failure/Acute Kidney Injury Acute on Chronic Renal Failure - please identify stage of if indicated. GFR's ranging from 58 to 87 Chronic Renal Failure Acute on Chronic Renal Insufficiency as documented - codes to disorder of kidney Other Condition  Thank You, Shellee MiloEileen T Jaleel Allen ,RN Clinical Documentation Specialist:  [phone]  [facility]- Health Information Management

## 2014-03-29 ENCOUNTER — Inpatient Hospital Stay (HOSPITAL_COMMUNITY): Payer: 59

## 2014-03-29 DIAGNOSIS — J9601 Acute respiratory failure with hypoxia: Secondary | ICD-10-CM

## 2014-03-29 LAB — BASIC METABOLIC PANEL
Anion gap: 6 (ref 5–15)
BUN: 19 mg/dL (ref 6–23)
CALCIUM: 8.1 mg/dL — AB (ref 8.4–10.5)
CO2: 21 mmol/L (ref 19–32)
Chloride: 116 mEq/L — ABNORMAL HIGH (ref 96–112)
Creatinine, Ser: 0.94 mg/dL (ref 0.50–1.35)
GFR calc Af Amer: >90 mL/min (ref >90)
GLUCOSE: 97 mg/dL (ref 70–99)
POTASSIUM: 3.8 mmol/L (ref 3.5–5.1)
Sodium: 143 mmol/L (ref 135–145)

## 2014-03-29 LAB — GLUCOSE, CAPILLARY
GLUCOSE-CAPILLARY: 93 mg/dL (ref 70–99)
GLUCOSE-CAPILLARY: 98 mg/dL (ref 70–99)
Glucose-Capillary: 89 mg/dL (ref 70–99)
Glucose-Capillary: 125 mg/dL — ABNORMAL HIGH (ref 70–99)
Glucose-Capillary: 103 mg/dL — ABNORMAL HIGH (ref 70–99)
Glucose-Capillary: 100 mg/dL — ABNORMAL HIGH (ref 70–99)

## 2014-03-29 LAB — CBC
HEMATOCRIT: 34.3 % — AB (ref 39.0–52.0)
HEMOGLOBIN: 10.8 g/dL — AB (ref 13.0–17.0)
MCH: 30.1 pg (ref 26.0–34.0)
MCHC: 31.5 g/dL (ref 30.0–36.0)
MCV: 95.5 fL (ref 78.0–100.0)
Platelets: 181 10*3/uL (ref 150–400)
RBC: 3.59 MIL/uL — AB (ref 4.22–5.81)
RDW: 13.8 % (ref 11.5–15.5)
WBC: 11.3 10*3/uL — AB (ref 4.0–10.5)

## 2014-03-29 LAB — LIPID PANEL
CHOLESTEROL: 151 mg/dL (ref 0–200)
HDL: 35 mg/dL — ABNORMAL LOW (ref >39)
LDL CALC: 80 mg/dL (ref 0–99)
Total CHOL/HDL Ratio: 4.3 RATIO
Triglycerides: 182 mg/dL — ABNORMAL HIGH (ref <150)
VLDL: 36 mg/dL (ref 0–40)

## 2014-03-29 MED ORDER — MIDAZOLAM HCL 2 MG/2ML IJ SOLN
2.0000 mg | INTRAMUSCULAR | Status: DC | PRN
  Administered 2014-03-29 – 2014-03-30 (×13): 2 mg via INTRAVENOUS
  Administered 2014-03-30 (×2): 4 mg via INTRAVENOUS
  Administered 2014-03-30 (×2): 2 mg via INTRAVENOUS
  Administered 2014-03-30: 4 mg via INTRAVENOUS
  Administered 2014-03-30 – 2014-03-31 (×4): 2 mg via INTRAVENOUS
  Administered 2014-03-31 (×2): 4 mg via INTRAVENOUS
  Filled 2014-03-29 (×2): qty 2
  Filled 2014-03-29 (×2): qty 4
  Filled 2014-03-29 (×6): qty 2
  Filled 2014-03-29: qty 4
  Filled 2014-03-29 (×2): qty 2
  Filled 2014-03-29: qty 4
  Filled 2014-03-29: qty 2
  Filled 2014-03-29: qty 4
  Filled 2014-03-29: qty 2
  Filled 2014-03-29 (×3): qty 4

## 2014-03-29 MED ORDER — MIDAZOLAM HCL 2 MG/2ML IJ SOLN
INTRAMUSCULAR | Status: AC
  Filled 2014-03-29: qty 2

## 2014-03-29 NOTE — Progress Notes (Signed)
PULMONARY / CRITICAL CARE MEDICINE   Name: [name] [name] MRN: [id number] DOB: [birth year]    ADMISSION DATE:  03/26/2014 CONSULTATION DATE:  03/27/14  REFERRING MD :  [name]   CHIEF COMPLAINT:  Epiglottitis   INITIAL PRESENTATION: 44 yo male from presented to [facility] with sore throat, difficulty swallowing, dyspnea, stridor from epiglottitis.  Had laryngoscopy in OR by ENT, and remained on vent afterward.  PCCM consulted post op for vent management.   STUDIES:  1/03 Laryngoscopy >> Supraglottic larynx with marked edema, inflammation with purpuric appearance 1/05 Echo >> mild LVH, EF 55 to 60%  SIGNIFICANT EVENTS: 1/03 to OR, intubated  SUBJECTIVE:  Tolerated SBT briefly >> fell asleep and then had periods of apnea.  VITAL SIGNS: Temp:  [98.1 F (36.7 C)-99.2 F (37.3 C)] 98.1 F (36.7 C) (01/06 0753) Pulse Rate:  [40-77] 53 (01/06 0900) Resp:  [0-15] 0 (01/06 0900) BP: (91-130)/(51-79) 95/52 mmHg (01/06 0900) SpO2:  [97 %-100 %] 97 % (01/06 0900) FiO2 (%):  [40 %] 40 % (01/06 0858) Weight:  [224 lb 6.9 oz (101.8 kg)] 224 lb 6.9 oz (101.8 kg) (01/06 0430) VENTILATOR SETTINGS: Vent Mode:  [-] PRVC FiO2 (%):  [40 %] 40 % Set Rate:  [14 bmp] 14 bmp Vt Set:  [650 mL] 650 mL PEEP:  [5 cmH20] 5 cmH20 Plateau Pressure:  [17 cmH20-27 cmH20] 25 cmH20 INTAKE / OUTPUT:  Intake/Output Summary (Last 24 hours) at 03/29/14 0925 Last data filed at 03/29/14 0900  Gross per 24 hour  Intake   6765 ml  Output   3580 ml  Net   3185 ml    PHYSICAL EXAMINATION: General: no distress Neuro: RASS -2 HEENT: ETT in place Cardiovascular: regular Lungs: scattered rhonchi Abdomen:  Soft, non tender Musculoskeletal: no edema  LABS:  CBC  Recent Labs Lab 03/27/14 0318 03/28/14 0247 03/29/14 0631  WBC 17.9* 16.6* 11.3*  HGB 12.4* 11.3* 10.8*  HCT 37.6* 35.3* 34.3*  PLT 170 154 181   Coag's  Recent Labs Lab 03/27/14 0817  APTT 28  INR 1.22   BMET  Recent Labs Lab  03/27/14 0624 03/28/14 0247 03/29/14 0631  NA 137 142 143  K 4.0 4.3 3.8  CL 108 114* 116*  CO2 BUN CREATININE 1.39* 1.02 0.94  GLUCOSE 174* 110* 97   Electrolytes  Recent Labs Lab 03/27/14 0624 03/28/14 0247 03/29/14 0631  CALCIUM 8.5 8.1* 8.1*   Sepsis Markers  Recent Labs Lab 03/27/14 0817 03/27/14 1935  LATICACIDVEN 2.1 1.2   ABG  Recent Labs Lab 03/27/14 0339  PHART 7.359  PCO2ART 36.0  PO2ART 104.0*   Liver Enzymes  Recent Labs Lab 03/28/14 0247  AST 15  ALT 12  ALKPHOS 24*  BILITOT 0.3  ALBUMIN 2.8*   Cardiac Enzymes  Recent Labs Lab 03/27/14 0817  TROPONINI <0.03   Glucose  Recent Labs Lab 03/27/14 2334 03/28/14 0408 03/28/14 1949 03/28/14 2350 03/29/14 0349 03/29/14 0750  GLUCAP 104* 93 101* 95 93 89    Imaging Dg Chest Port 1 View  03/28/2014   CLINICAL DATA:  Acute respiratory distress.  EXAM: PORTABLE CHEST - 1 VIEW  COMPARISON:  March 27, 2014.  FINDINGS: The heart size and mediastinal contours are within normal limits. Endotracheal tube is in grossly good position with distal tip 5 cm above the carina. Nasogastric tube is seen passing through the esophagus into the stomach. No pneumothorax or pleural effusion is noted. Both  lungs are clear. The visualized skeletal structures are unremarkable.  IMPRESSION: Stable support apparatus. No acute cardiopulmonary abnormality seen.   Electronically Signed   By: Roque LiasJames  Green M.D.   On: 03/28/2014 07:35     ASSESSMENT / PLAN:  PULMONARY ETT 1/3>>> A: Acute respiratory failure 2nd to epiglottitis. Hx of OSA. P:   Can wean on pressure support as tolerated F/u CXR Plan to have airway exam in OR on 1/07, and then decide about extubation vs tracheostomy  CARDIOVASCULAR A: Hypotension 2nd to hypovolemia, sedation. Hx CAD, HLD.  P:  IV fluids Monitor hemodynamics Continue ASA, zocor  RENAL A: Acute kidney injury 2nd to volume depletion >> resolved. P:    Monitor renal fx, urine outpt, electrolytes  GASTROINTESTINAL A: Nutrition. P:   Protonix TF while on vent  HEMATOLOGIC A: Leukocytosis. P:  F/u CBC Lovenox for DVT prevention >> hold in anticipation of OR trip 1/07  INFECTIOUS A: Epiglottitis  P:   Day 4 rocephin  ENDOCRINE A: No active issues. P:   Monitor glucose on chem   NEUROLOGIC A: Sedation. P:   RASS goal: -1 Cont propofol, fentanyl gtt >> need to use short acting agents so that he can be awake enough to extubate if possible during OR trip on 1/07 Daily WUA   Inter-disciplinary family meet or Palliative Care meeting due by:  04/02/14  SUMMARY: Continue ETT >> plan to examine airway in OR on 1/07.  Discussed plan with Dr. Jenne PaneBates.  CC time 35 minutes.  Coralyn HellingVineet Larson Limones, MD [facility] 03/29/2014, 9:25 AM Pager:  [phone] After 3pm call: [phone]

## 2014-03-29 NOTE — Progress Notes (Signed)
2 Days Post-Op  Subjective: Remains intubated and sedated.  Agitated again overnight at times.  Objective: Vital signs in last 24 hours: Temp:  [98.1 F (36.7 C)-99.2 F (37.3 C)] 98.1 F (36.7 C) (01/06 0753) Pulse Rate:  [40-77] 63 (01/06 0800) Resp:  [0-15] 14 (01/06 0800) BP: (91-130)/(51-79) 105/60 mmHg (01/06 0800) SpO2:  [97 %-100 %] 99 % (01/06 0800) FiO2 (%):  [40 %] 40 % (01/06 0800) Weight:  [101.8 kg (224 lb 6.9 oz)] 101.8 kg (224 lb 6.9 oz) (01/06 0430)    Intake/Output from previous day: 01/05 0701 - 01/06 0700 In: 6809 [I.V.:4399; NG/GT:1310; IV Piggyback:1100] Out: 3215 [Urine:3215] Intake/Output this shift: Total I/O In: 176 [I.V.:176] Out: 300 [Urine:300]  General appearance: no distress and sedated Throat: endotracheal intubation  Lab Results:   Recent Labs  03/28/14 0247 03/29/14 0631  WBC 16.6* 11.3*  HGB 11.3* 10.8*  HCT 35.3* 34.3*  PLT 154 181   BMET  Recent Labs  03/28/14 0247 03/29/14 0631  NA 142 143  K 4.3 3.8  CL 114* 116*  CO2 20 21  GLUCOSE 110* 97  BUN 15 19  CREATININE 1.02 0.94  CALCIUM 8.1* 8.1*   PT/INR  Recent Labs  03/27/14 0817  LABPROT 15.5*  INR 1.22   ABG  Recent Labs  03/27/14 0339  PHART 7.359  HCO3 19.8*    Studies/Results: Dg Chest Port 1 View  03/29/2014   CLINICAL DATA:  Congestion.  EXAM: PORTABLE CHEST - 1 VIEW  COMPARISON:  03/28/2014.  FINDINGS: Tracheostomy tube and NG tube in stable position. Cardiomegaly with bilateral interstitial infiltrates and small pleural effusion consistent with congestive heart failure. No pneumothorax. No acute bony abnormality .  IMPRESSION: 1. Lines and tubes in stable position. 2. Congestive heart failure with bilateral pulmonary interstitial edema and small pleural effusions.   Electronically Signed   By: Maisie Fushomas  Register   On: 03/29/2014 07:52   Dg Chest Port 1 View  03/28/2014   CLINICAL DATA:  Acute respiratory distress.  EXAM: PORTABLE CHEST - 1 VIEW   COMPARISON:  March 27, 2014.  FINDINGS: The heart size and mediastinal contours are within normal limits. Endotracheal tube is in grossly good position with distal tip 5 cm above the carina. Nasogastric tube is seen passing through the esophagus into the stomach. No pneumothorax or pleural effusion is noted. Both lungs are clear. The visualized skeletal structures are unremarkable.  IMPRESSION: Stable support apparatus. No acute cardiopulmonary abnormality seen.   Electronically Signed   By: Roque LiasJames  Green M.D.   On: 03/28/2014 07:35    Anti-infectives: Anti-infectives    Start     Dose/Rate Route Frequency Ordered Stop   03/27/14 1000  cefTRIAXone (ROCEPHIN) 1 g in dextrose 5 % 50 mL IVPB - Premix     1 g100 mL/hr over 30 Minutes Intravenous Every 12 hours 03/27/14 0210     03/26/14 2315  cefTRIAXone (ROCEPHIN) 2 g in dextrose 5 % 50 mL IVPB     2 g100 mL/hr over 30 Minutes Intravenous  Once 03/26/14 2302 03/26/14 2341      Assessment/Plan: Acute supraglottitis s/p Procedure(s): Fiberoptic Intubation (N/A) Scheduled for direct laryngoscopy in OR tomorrow at 11:00.  Hold tube feeds at midnight.  Will request that CCM discuss sedation with anesthesiology to see if he can be extubated in the OR after laryngoscopy, if appropriate.  LOS: 3 days    Jashira Cotugno 03/29/2014

## 2014-03-29 NOTE — Progress Notes (Signed)
RT note- will be going to OR for evaluation for extubation possibly 1/7, no wean at this time.

## 2014-03-30 ENCOUNTER — Inpatient Hospital Stay (HOSPITAL_COMMUNITY): Payer: 59

## 2014-03-30 ENCOUNTER — Encounter (HOSPITAL_COMMUNITY)
Admission: EM | Disposition: A | Discharge status: Home / Self Care | Payer: Self-pay | Source: Home / Self Care | Attending: Otolaryngology

## 2014-03-30 ENCOUNTER — Inpatient Hospital Stay (HOSPITAL_COMMUNITY): Payer: 59 | Admitting: Anesthesiology

## 2014-03-30 ENCOUNTER — Encounter (HOSPITAL_COMMUNITY): Payer: Self-pay | Admitting: Anesthesiology

## 2014-03-30 DIAGNOSIS — J9601 Acute respiratory failure with hypoxia: Secondary | ICD-10-CM

## 2014-03-30 HISTORY — PX: TRACHEOSTOMY TUBE PLACEMENT: SHX814

## 2014-03-30 HISTORY — PX: DIRECT LARYNGOSCOPY: SHX5326

## 2014-03-30 LAB — GLUCOSE, CAPILLARY
GLUCOSE-CAPILLARY: 97 mg/dL (ref 70–99)
GLUCOSE-CAPILLARY: 111 mg/dL — AB (ref 70–99)
GLUCOSE-CAPILLARY: 83 mg/dL (ref 70–99)
GLUCOSE-CAPILLARY: 69 mg/dL — AB (ref 70–99)
GLUCOSE-CAPILLARY: 92 mg/dL (ref 70–99)
Glucose-Capillary: 75 mg/dL (ref 70–99)
Glucose-Capillary: 86 mg/dL (ref 70–99)
Glucose-Capillary: 87 mg/dL (ref 70–99)
Glucose-Capillary: 90 mg/dL (ref 70–99)
Glucose-Capillary: 97 mg/dL (ref 70–99)

## 2014-03-30 LAB — CBC
HCT: 33.8 % — ABNORMAL LOW (ref 39.0–52.0)
Hemoglobin: 10.8 g/dL — ABNORMAL LOW (ref 13.0–17.0)
MCH: 30.1 pg (ref 26.0–34.0)
MCHC: 32 g/dL (ref 30.0–36.0)
MCV: 94.2 fL (ref 78.0–100.0)
PLATELETS: 196 10*3/uL (ref 150–400)
RBC: 3.59 MIL/uL — ABNORMAL LOW (ref 4.22–5.81)
RDW: 13.5 % (ref 11.5–15.5)
WBC: 7.5 10*3/uL (ref 4.0–10.5)

## 2014-03-30 LAB — BASIC METABOLIC PANEL
ANION GAP: 6 (ref 5–15)
BUN: 13 mg/dL (ref 6–23)
CHLORIDE: 114 meq/L — AB (ref 96–112)
CO2: 24 mmol/L (ref 19–32)
Calcium: 8.2 mg/dL — ABNORMAL LOW (ref 8.4–10.5)
Creatinine, Ser: 0.89 mg/dL (ref 0.50–1.35)
GFR calc Af Amer: >90 mL/min (ref >90)
GFR calc non Af Amer: >90 mL/min (ref >90)
Glucose, Bld: 98 mg/dL (ref 70–99)
Potassium: 3.8 mmol/L (ref 3.5–5.1)
Sodium: 144 mmol/L (ref 135–145)

## 2014-03-30 SURGERY — LARYNGOSCOPY, DIRECT
Anesthesia: General | Site: Mouth

## 2014-03-30 MED ORDER — LIDOCAINE-EPINEPHRINE 1 %-1:100000 IJ SOLN
INTRAMUSCULAR | Status: DC | PRN
  Administered 2014-03-30: 20 mL

## 2014-03-30 MED ORDER — LIDOCAINE-EPINEPHRINE 1 %-1:100000 IJ SOLN
INTRAMUSCULAR | Status: AC
  Filled 2014-03-30: qty 1

## 2014-03-30 MED ORDER — ROCURONIUM BROMIDE 50 MG/5ML IV SOLN
INTRAVENOUS | Status: AC
  Filled 2014-03-30: qty 1

## 2014-03-30 MED ORDER — LACTATED RINGERS IV SOLN
INTRAVENOUS | Status: DC | PRN
  Administered 2014-03-30: 12:00:00 via INTRAVENOUS

## 2014-03-30 MED ORDER — PROPOFOL 10 MG/ML IV BOLUS
INTRAVENOUS | Status: AC
  Filled 2014-03-30: qty 20

## 2014-03-30 MED ORDER — ONDANSETRON HCL 4 MG/2ML IJ SOLN
INTRAMUSCULAR | Status: DC | PRN
  Administered 2014-03-30: 4 mg via INTRAVENOUS

## 2014-03-30 MED ORDER — SODIUM CHLORIDE 0.9 % IJ SOLN
INTRAMUSCULAR | Status: AC
  Filled 2014-03-30: qty 10

## 2014-03-30 MED ORDER — EPINEPHRINE HCL (NASAL) 0.1 % NA SOLN
NASAL | Status: AC
  Filled 2014-03-30: qty 30

## 2014-03-30 MED ORDER — ONDANSETRON HCL 4 MG/2ML IJ SOLN
INTRAMUSCULAR | Status: AC
  Filled 2014-03-30: qty 2

## 2014-03-30 MED ORDER — MIDAZOLAM HCL 2 MG/2ML IJ SOLN
INTRAMUSCULAR | Status: AC
  Filled 2014-03-30: qty 2

## 2014-03-30 MED ORDER — DEXTROSE 50 % IV SOLN
25.0000 mL | Freq: Once | INTRAVENOUS | Status: AC
  Administered 2014-03-30: 25 mL via INTRAVENOUS

## 2014-03-30 MED ORDER — ROCURONIUM BROMIDE 100 MG/10ML IV SOLN
INTRAVENOUS | Status: DC | PRN
  Administered 2014-03-30: 30 mg via INTRAVENOUS

## 2014-03-30 MED ORDER — OXYMETAZOLINE HCL 0.05 % NA SOLN
NASAL | Status: AC
  Filled 2014-03-30: qty 15

## 2014-03-30 MED ORDER — MIDAZOLAM HCL 5 MG/5ML IJ SOLN
INTRAMUSCULAR | Status: DC | PRN
  Administered 2014-03-30 (×2): 2 mg via INTRAVENOUS

## 2014-03-30 MED ORDER — PHENYLEPHRINE 40 MCG/ML (10ML) SYRINGE FOR IV PUSH (FOR BLOOD PRESSURE SUPPORT)
PREFILLED_SYRINGE | INTRAVENOUS | Status: AC
  Filled 2014-03-30: qty 10

## 2014-03-30 MED ORDER — FENTANYL CITRATE 0.05 MG/ML IJ SOLN
INTRAMUSCULAR | Status: AC
  Filled 2014-03-30: qty 5

## 2014-03-30 MED ORDER — DEXTROSE 50 % IV SOLN
INTRAVENOUS | Status: AC
  Filled 2014-03-30: qty 50

## 2014-03-30 MED ORDER — 0.9 % SODIUM CHLORIDE (POUR BTL) OPTIME
TOPICAL | Status: DC | PRN
  Administered 2014-03-30: 1000 mL

## 2014-03-30 SURGICAL SUPPLY — 46 items
BALLN PULM 15 16.5 18 X 75CM (BALLOONS)
BALLN PULM 15 16.5 18X75 (BALLOONS)
BALLOON PULM 15 16.5 18X75 (BALLOONS) IMPLANT
CANISTER SUCTION 2500CC (MISCELLANEOUS) ×3 IMPLANT
CONT SPEC 4OZ CLIKSEAL STRL BL (MISCELLANEOUS) IMPLANT
COVER MAYO STAND STRL (DRAPES) ×3 IMPLANT
COVER SURGICAL LIGHT HANDLE (MISCELLANEOUS) ×2 IMPLANT
COVER TABLE BACK 60X90 (DRAPES) ×3 IMPLANT
CRADLE DONUT ADULT HEAD (MISCELLANEOUS) IMPLANT
DRAPE PROXIMA HALF (DRAPES) ×7 IMPLANT
ELECT COATED BLADE 2.86 ST (ELECTRODE) ×2 IMPLANT
GAUZE SPONGE 4X4 16PLY XRAY LF (GAUZE/BANDAGES/DRESSINGS) ×3 IMPLANT
GLOVE BIO SURGEON STRL SZ7.5 (GLOVE) ×5 IMPLANT
GLOVE BIOGEL PI IND STRL 7.0 (GLOVE) IMPLANT
GLOVE BIOGEL PI INDICATOR 7.0 (GLOVE) ×2
GLOVE SURG SS PI 7.0 STRL IVOR (GLOVE) ×2 IMPLANT
GOWN STRL REUS W/ TWL LRG LVL3 (GOWN DISPOSABLE) IMPLANT
GOWN STRL REUS W/TWL LRG LVL3 (GOWN DISPOSABLE) ×6
GUARD TEETH (MISCELLANEOUS) ×3 IMPLANT
HOLDER TRACH TUBE VELCRO 19.5 (MISCELLANEOUS) ×2 IMPLANT
KIT ROOM TURNOVER OR (KITS) ×3 IMPLANT
NDL HYPO 25GX1X1/2 BEV (NEEDLE) IMPLANT
NDL TRANS ORAL INJECTION (NEEDLE) IMPLANT
NEEDLE 22X1 1/2 (OR ONLY) (NEEDLE) ×2 IMPLANT
NEEDLE HYPO 25GX1X1/2 BEV (NEEDLE) ×3 IMPLANT
NEEDLE TRANS ORAL INJECTION (NEEDLE) IMPLANT
NS IRRIG 1000ML POUR BTL (IV SOLUTION) ×3 IMPLANT
PAD ARMBOARD 7.5X6 YLW CONV (MISCELLANEOUS) ×6 IMPLANT
PATTIES SURGICAL .5 X3 (DISPOSABLE) IMPLANT
PENCIL BUTTON HOLSTER BLD 10FT (ELECTRODE) ×2 IMPLANT
SOLUTION ANTI FOG 6CC (MISCELLANEOUS) ×2 IMPLANT
SPONGE DRAIN TRACH 4X4 STRL 2S (GAUZE/BANDAGES/DRESSINGS) ×2 IMPLANT
SURGILUBE 2OZ TUBE FLIPTOP (MISCELLANEOUS) ×2 IMPLANT
SUT ETHILON 2 0 FS 18 (SUTURE) ×2 IMPLANT
SUT SILK 2 0 (SUTURE) ×3
SUT SILK 2 0 SH CR/8 (SUTURE) ×2 IMPLANT
SUT SILK 2-0 18XBRD TIE 12 (SUTURE) IMPLANT
SUT SILK 3 0 (SUTURE) ×3
SUT SILK 3-0 18XBRD TIE 12 (SUTURE) IMPLANT
SWAB COLLECTION DEVICE MRSA (MISCELLANEOUS) ×2 IMPLANT
SYR 20ML ECCENTRIC (SYRINGE) ×2 IMPLANT
SYR CONTROL 10ML LL (SYRINGE) ×2 IMPLANT
TOWEL OR 17X24 6PK STRL BLUE (TOWEL DISPOSABLE) ×6 IMPLANT
TUBE CONNECTING 12'X1/4 (SUCTIONS) ×2
TUBE CONNECTING 12X1/4 (SUCTIONS) ×3 IMPLANT
YANKAUER SUCT BULB TIP NO VENT (SUCTIONS) ×4 IMPLANT

## 2014-03-30 NOTE — Transfer of Care (Signed)
Immediate Anesthesia Transfer of Care Note  Patient: [name] [name]  Procedure(s) Performed: Procedure(s): DIRECT LARYNGOSCOPY (N/A) TRACHEOSTOMY (N/A)  Patient Location: SICU  Anesthesia Type:General  Level of Consciousness: sedated and Patient remains intubated per anesthesia plan  Airway & Oxygen Therapy: Patient remains intubated per anesthesia plan and Patient placed on Ventilator (see vital sign flow sheet for setting)  Post-op Assessment: Post -op Vital signs reviewed and stable and Report to SICU RN  Post vital signs: Reviewed and stable  Complications: No apparent anesthesia complications

## 2014-03-30 NOTE — Brief Op Note (Signed)
03/26/2014 - 03/30/2014  12:54 PM  PATIENT:  [name] [name]  44 y.o. male  PRE-OPERATIVE DIAGNOSIS:  supraglottitis  POST-OPERATIVE DIAGNOSIS:  supraglottitis  PROCEDURE:  Procedure(s): DIRECT LARYNGOSCOPY (N/A) TRACHEOSTOMY (N/A)  SURGEON:  Surgeon(s) and Role:    * Christia Readingwight Iosefa Weintraub, MD - Primary  PHYSICIAN ASSISTANT:   ASSISTANTS: none   ANESTHESIA:   general  EBL:  Total I/O In: 720.2 [I.V.:670.2; IV Piggyback:50] Out: 950 [Urine:950]  BLOOD ADMINISTERED:none  DRAINS: none   LOCAL MEDICATIONS USED:  LIDOCAINE   SPECIMEN:  Source of Specimen:  Culture of epiglottis  DISPOSITION OF SPECIMEN:  MICRO  COUNTS:  YES  TOURNIQUET:  * No tourniquets in log *  DICTATION: .Other Dictation: Dictation Number [id number]  PLAN OF CARE: Return to ICU  PATIENT DISPOSITION:  ICU - intubated and hemodynamically stable.   Delay start of Pharmacological VTE agent (>24hrs) due to surgical blood loss or risk of bleeding: no

## 2014-03-30 NOTE — Anesthesia Preprocedure Evaluation (Addendum)
Anesthesia Evaluation  Patient identified by MRN, date of birth, ID band Patient unresponsive    Reviewed: Allergy & Precautions, H&P , NPO status , Patient's Chart, lab work & pertinent test results, reviewed documented beta blocker date and time   Airway Mallampati: III       Dental   Pulmonary sleep apnea ,          Cardiovascular On Medications + CAD and + Past MI     Neuro/Psych    GI/Hepatic   Endo/Other    Renal/GU      Musculoskeletal   Abdominal   Peds  Hematology   Anesthesia Other Findings   Reproductive/Obstetrics negative OB ROS                             Anesthesia Physical Anesthesia Plan  ASA: III  Anesthesia Plan:    Post-op Pain Management:    Induction: Inhalational  Airway Management Planned:   Additional Equipment:   Intra-op Plan:   Post-operative Plan:   Informed Consent:   Plan Discussed with:   Anesthesia Plan Comments:         Anesthesia Quick Evaluation

## 2014-03-30 NOTE — Progress Notes (Signed)
PULMONARY / CRITICAL CARE MEDICINE   Name: [name] [name] MRN: [id number] DOB: [birth year]    ADMISSION DATE:  03/26/2014 CONSULTATION DATE:  03/27/14  REFERRING MD :  [name]   CHIEF COMPLAINT:  Epiglottitis   INITIAL PRESENTATION: 44 yo male from presented to [facility] with sore throat, difficulty swallowing, dyspnea, stridor from epiglottitis.  Had laryngoscopy in OR by ENT, and remained on vent afterward.  PCCM consulted post op for vent management.   STUDIES:  1/03 Laryngoscopy >> Supraglottic larynx with marked edema, inflammation with purpuric appearance 1/05 Echo >> mild LVH, EF 55 to 60%  SIGNIFICANT EVENTS: 1/03 to OR, intubated  SUBJECTIVE:  No acute events  VITAL SIGNS: Temp:  [98.1 F (36.7 C)-99.6 F (37.6 C)] 98.7 F (37.1 C) (01/07 0352) Pulse Rate:  [44-83] 44 (01/07 0700) Resp:  [0-25] 0 (01/07 0700) BP: (90-120)/(49-79) 112/69 mmHg (01/07 0700) SpO2:  [95 %-100 %] 98 % (01/07 0700) FiO2 (%):  [40 %] 40 % (01/07 0511) Weight:  [107.5 kg (236 lb 15.9 oz)] 107.5 kg (236 lb 15.9 oz) (01/07 0400) VENTILATOR SETTINGS: Vent Mode:  [-] PRVC FiO2 (%):  [40 %] 40 % Set Rate:  [14 bmp] 14 bmp Vt Set:  [650 mL] 650 mL PEEP:  [5 cmH20] 5 cmH20 Plateau Pressure:  [18 cmH20-25 cmH20] 18 cmH20 INTAKE / OUTPUT:  Intake/Output Summary (Last 24 hours) at 03/30/14 0741 Last data filed at 03/30/14 0700  Gross per 24 hour  Intake 5722.1 ml  Output   3975 ml  Net 1747.1 ml    PHYSICAL EXAMINATION: General: sedated on vent Neuro: Sedated on vent HEENT: ETT in place, NCAT Cardiovascular: RRR, no mgr Lungs: few rhonchi bilaterally Abdomen:  Soft, non tender, BS+ Musculoskeletal: no edema, warm  LABS:  CBC  Recent Labs Lab 03/28/14 0247 03/29/14 0631 03/30/14 0337  WBC 16.6* 11.3* 7.5  HGB 11.3* 10.8* 10.8*  HCT 35.3* 34.3* 33.8*  PLT 154 181 196   Coag's  Recent Labs Lab 03/27/14 0817  APTT 28  INR 1.22   BMET  Recent Labs Lab 03/28/14 0247  03/29/14 0631 03/30/14 0337  NA 142 143 144  K 4.3 3.8 3.8  CL 114* 116* 114*  CO2 BUN CREATININE 1.02 0.94 0.89  GLUCOSE 110* 97 98   Electrolytes  Recent Labs Lab 03/28/14 0247 03/29/14 0631 03/30/14 0337  CALCIUM 8.1* 8.1* 8.2*   Sepsis Markers  Recent Labs Lab 03/27/14 0817 03/27/14 1935  LATICACIDVEN 2.1 1.2   ABG  Recent Labs Lab 03/27/14 0339  PHART 7.359  PCO2ART 36.0  PO2ART 104.0*   Liver Enzymes  Recent Labs Lab 03/28/14 0247  AST 15  ALT 12  ALKPHOS 24*  BILITOT 0.3  ALBUMIN 2.8*   Cardiac Enzymes  Recent Labs Lab 03/27/14 0817  TROPONINI <0.03   Glucose  Recent Labs Lab 03/29/14 0750 03/29/14 1150 03/29/14 1553 03/29/14 1912 03/29/14 2328 03/30/14 0329  GLUCAP 89 125* 103* 98 100* 83    Imaging Dg Chest Port 1 View  03/29/2014   CLINICAL DATA:  Congestion.  EXAM: PORTABLE CHEST - 1 VIEW  COMPARISON:  03/28/2014.  FINDINGS: Tracheostomy tube and NG tube in stable position. Cardiomegaly with bilateral interstitial infiltrates and small pleural effusion consistent with congestive heart failure. No pneumothorax. No acute bony abnormality .  IMPRESSION: 1. Lines and tubes in stable position. 2. Congestive heart failure with bilateral pulmonary interstitial edema and small pleural effusions.  Electronically Signed   By: Maisie Fushomas  Register   On: 03/29/2014 07:52     ASSESSMENT / PLAN:  PULMONARY ETT 1/3>>> A: Acute respiratory failure 2nd to epiglottitis, vent mechanics and oxygenation OK Hx of OSA P:   Plan to have airway exam in OR Today, and then decide about extubation vs tracheostomy Otherwise, continue full support with daily PSV trials  CARDIOVASCULAR A: Hypotension 2nd to hypovolemia, sedation > resolved Hx CAD, HLD P:  Continue IV fluids, decrease rate Tele Continue ASA, zocor  RENAL A: No acute issues P:   Monitor BMET and UOP Replace electrolytes as  needed  GASTROINTESTINAL A: Nutrition P:   Protonix TF while on vent  HEMATOLOGIC A: Leukocytosis P:  F/u CBC Lovenox for DVT prevention >> hold in anticipation of OR trip 1/07  INFECTIOUS A: Epiglottitis  P:   Day 5 rocephin  ENDOCRINE A: No active issues. P:   Monitor glucose on chem   NEUROLOGIC A: Sedation. P:   RASS goal: -2 Cont propofol, fentanyl gtt, and prn versed OR trip on 1/07 Daily WUA   Inter-disciplinary family meet or Palliative Care meeting due by:  04/02/14  SUMMARY: Continue ETT >> plan to examine airway in OR today.  Discussed plan with Dr. Jenne PaneBates.  CC time 35 minutes.  Heber CarolinaBrent Maui Britten, MD [facility] Pager: [phone] Cell: [phone] If no response, call [phone]

## 2014-03-30 NOTE — Anesthesia Postprocedure Evaluation (Signed)
  Anesthesia Post-op Note  Patient: [name] [name]  Procedure(s) Performed: Procedure(s): DIRECT LARYNGOSCOPY (N/A) TRACHEOSTOMY (N/A)  Patient Location: SICU  Anesthesia Type:General  Level of Consciousness: sedated and Patient remains intubated per anesthesia plan  Airway and Oxygen Therapy: Patient remains intubated per anesthesia plan and Patient placed on Ventilator (see vital sign flow sheet for setting)  Post-op Pain: none  Post-op Assessment: Post-op Vital signs reviewed, Patient's Cardiovascular Status Stable, Respiratory Function Stable and Patent Airway  Post-op Vital Signs: stable  Last Vitals:  Filed Vitals:   03/30/14 1600  BP: 96/56  Pulse: 49  Temp:   Resp: 14    Complications: No apparent anesthesia complications

## 2014-03-31 ENCOUNTER — Encounter (HOSPITAL_COMMUNITY): Payer: Self-pay | Admitting: Otolaryngology

## 2014-03-31 ENCOUNTER — Inpatient Hospital Stay (HOSPITAL_COMMUNITY): Payer: 59

## 2014-03-31 LAB — BASIC METABOLIC PANEL
Anion gap: 6 (ref 5–15)
BUN: 10 mg/dL (ref 6–23)
CO2: 25 mmol/L (ref 19–32)
CREATININE: 0.95 mg/dL (ref 0.50–1.35)
Calcium: 8.6 mg/dL (ref 8.4–10.5)
Chloride: 109 mEq/L (ref 96–112)
GFR calc Af Amer: >90 mL/min (ref >90)
GLUCOSE: 98 mg/dL (ref 70–99)
Potassium: 3.5 mmol/L (ref 3.5–5.1)
Sodium: 140 mmol/L (ref 135–145)

## 2014-03-31 LAB — CBC
HCT: 36.6 % — ABNORMAL LOW (ref 39.0–52.0)
HEMOGLOBIN: 11.9 g/dL — AB (ref 13.0–17.0)
MCH: 30.6 pg (ref 26.0–34.0)
MCHC: 32.5 g/dL (ref 30.0–36.0)
MCV: 94.1 fL (ref 78.0–100.0)
Platelets: 217 10*3/uL (ref 150–400)
RBC: 3.89 MIL/uL — ABNORMAL LOW (ref 4.22–5.81)
RDW: 13.3 % (ref 11.5–15.5)
WBC: 8.5 10*3/uL (ref 4.0–10.5)

## 2014-03-31 LAB — GLUCOSE, CAPILLARY
GLUCOSE-CAPILLARY: 95 mg/dL (ref 70–99)
GLUCOSE-CAPILLARY: 96 mg/dL (ref 70–99)
Glucose-Capillary: 94 mg/dL (ref 70–99)
Glucose-Capillary: 92 mg/dL (ref 70–99)
Glucose-Capillary: 103 mg/dL — ABNORMAL HIGH (ref 70–99)
Glucose-Capillary: 99 mg/dL (ref 70–99)

## 2014-03-31 LAB — CULTURE, RESPIRATORY: CULTURE: NO GROWTH

## 2014-03-31 LAB — CULTURE, RESPIRATORY W GRAM STAIN

## 2014-03-31 MED ORDER — FENTANYL CITRATE 0.05 MG/ML IJ SOLN: 25.0000 ug | INTRAMUSCULAR | Status: DC | PRN

## 2014-03-31 MED ORDER — ACETAMINOPHEN 160 MG/5ML PO SOLN
650.0000 mg | Freq: Four times a day (QID) | ORAL | Status: DC | PRN
  Administered 2014-04-02 – 2014-04-04 (×5): 650 mg via ORAL
  Filled 2014-03-31 (×5): qty 20.3

## 2014-03-31 MED ORDER — FENTANYL CITRATE 0.05 MG/ML IJ SOLN
25.0000 ug | INTRAMUSCULAR | Status: DC | PRN
  Administered 2014-03-31 – 2014-04-01 (×7): 100 ug via INTRAVENOUS
  Filled 2014-03-31 (×7): qty 2

## 2014-03-31 MED ORDER — ACETAMINOPHEN 650 MG RE SUPP
650.0000 mg | Freq: Four times a day (QID) | RECTAL | Status: DC | PRN
Start: 2014-03-31 — End: 2014-04-04

## 2014-03-31 MED ORDER — ONDANSETRON HCL 4 MG/2ML IJ SOLN
4.0000 mg | Freq: Four times a day (QID) | INTRAMUSCULAR | Status: DC | PRN
  Administered 2014-03-31: 4 mg via INTRAVENOUS
  Filled 2014-03-31: qty 2

## 2014-03-31 MED ORDER — KCL IN DEXTROSE-NACL 20-5-0.45 MEQ/L-%-% IV SOLN
INTRAVENOUS | Status: DC
  Administered 2014-03-31: 11:00:00 via INTRAVENOUS
  Administered 2014-04-01: 50 mL/h via INTRAVENOUS
  Administered 2014-04-02 – 2014-04-03 (×2): via INTRAVENOUS
  Filled 2014-03-31 (×6): qty 1000

## 2014-03-31 MED ORDER — PANTOPRAZOLE SODIUM 40 MG IV SOLR
40.0000 mg | Freq: Every day | INTRAVENOUS | Status: DC
  Administered 2014-04-01 – 2014-04-03 (×3): 40 mg via INTRAVENOUS
  Filled 2014-03-31 (×5): qty 40

## 2014-03-31 MED ORDER — LORAZEPAM 2 MG/ML IJ SOLN
1.0000 mg | INTRAMUSCULAR | Status: DC | PRN
  Administered 2014-03-31 – 2014-04-01 (×4): 2 mg via INTRAVENOUS
  Administered 2014-04-01 (×2): 1 mg via INTRAVENOUS
  Administered 2014-04-02 – 2014-04-03 (×6): 2 mg via INTRAVENOUS
  Filled 2014-03-31 (×12): qty 1

## 2014-03-31 MED ORDER — ACETAMINOPHEN 160 MG/5ML PO SOLN: 650.0000 mg | Freq: Four times a day (QID) | ORAL | Status: DC | PRN

## 2014-03-31 MED ORDER — OXYCODONE-ACETAMINOPHEN 5-325 MG PO TABS: 1.0000 | ORAL_TABLET | Freq: Four times a day (QID) | ORAL | Status: DC | PRN

## 2014-03-31 NOTE — Progress Notes (Signed)
1 Day Post-Op  Subjective: Tolerating trach collar.  Has required intermittent anxiolytic today.  Foley catheter was removed which helped.  He had a Panda tube in place but became nauseated and he vomited out the tube.  Objective: Vital signs in last 24 hours: Temp:  [98.7 F (37.1 C)-102 F (38.9 C)] 100.9 F (38.3 C) (01/08 1514) Pulse Rate:  [48-114] 79 (01/08 1800) Resp:  [9-23] 23 (01/08 1700) BP: (99-145)/(52-118) 139/88 mmHg (01/08 1800) SpO2:  [86 %-100 %] 100 % (01/08 1800) FiO2 (%):  [28 %-40 %] 28 % (01/08 1800) Weight:  [106.2 kg (234 lb 2.1 oz)] 106.2 kg (234 lb 2.1 oz) (01/08 0450)    Intake/Output from previous day: 01/07 0701 - 01/08 0700 In: 3736.8 [I.V.:3203.2; NG/GT:433.7; IV Piggyback:100] Out: 4100 [Urine:4050; Blood:50] Intake/Output this shift: Total I/O In: 784 [I.V.:614; NG/GT:120; IV Piggyback:50] Out: 2075 [Urine:2075]  General appearance: cooperative and delirious Neck: #8 cuffed Shiley trach in place, mucus secretions, deflated cuff which was well-tolerated, unable to move air around occluded trach tube  Lab Results:   Recent Labs  03/30/14 0337 03/31/14 0255  WBC 7.5 8.5  HGB 10.8* 11.9*  HCT 33.8* 36.6*  PLT 196 217   BMET  Recent Labs  03/30/14 0337 03/31/14 0255  NA 144 140  K 3.8 3.5  CL 114* 109  CO2 24 25  GLUCOSE 98 98  BUN 13 10  CREATININE 0.89 0.95  CALCIUM 8.2* 8.6   PT/INR No results for input(s): LABPROT, INR in the last 72 hours. ABG No results for input(s): PHART, HCO3 in the last 72 hours.  Invalid input(s): PCO2, PO2  Studies/Results: Dg Chest Port 1 View  03/31/2014   CLINICAL DATA:  44 year old male status post direct laryngoscopy and tracheostomy, respiratory failure with hypoxia. Initial encounter.  EXAM: PORTABLE CHEST - 1 VIEW  COMPARISON:  03/29/2014 and earlier.  FINDINGS: Portable AP semi upright view at 0559 hrs. Endotracheal tube removed and tracheostomy tube now in place, projects in the midline  of the thoracic inlet. Enteric type tube replaced with enteric feeding tube which courses to the abdomen, tip not included. Suggestion of a small caliber vascular catheter at the right neck, but may be external artifact. No central venous line identified.  Stable cardiac size and mediastinal contours. Bilateral veiling pulmonary opacity with superimposed diffuse interstitial opacity. No pneumothorax.  IMPRESSION: 1. New tracheostomy tube with no adverse features. Feeding tube now in place, coursing to the abdomen with tip not included. 2. Bilateral pleural effusions and lower lobe collapse/consolidation. Suspect superimposed pulmonary interstitial edema.   Electronically Signed   By: Augusto Gamble M.D.   On: 03/31/2014 07:47   Dg Abd Portable 1v  03/30/2014   CLINICAL DATA:  Check feeding catheter placement  EXAM: PORTABLE ABDOMEN - 1 VIEW  COMPARISON:  None.  FINDINGS: Scattered large and small bowel gas is noted. A feeding tube is now seen within the stomach directed towards the left. No bony abnormality is noted.  IMPRESSION: Feeding tube within the stomach as described.   Electronically Signed   By: Alcide Clever M.D.   On: 03/30/2014 15:34    Anti-infectives: Anti-infectives    Start     Dose/Rate Route Frequency Ordered Stop   03/27/14 1000  cefTRIAXone (ROCEPHIN) 1 g in dextrose 5 % 50 mL IVPB - Premix     1 g100 mL/hr over 30 Minutes Intravenous Every 12 hours 03/27/14 0210     03/26/14 2315  cefTRIAXone (ROCEPHIN) 2  g in dextrose 5 % 50 mL IVPB     2 g100 mL/hr over 30 Minutes Intravenous  Once 03/26/14 2302 03/26/14 2341      Assessment/Plan: Acute supraglottitis s/p Procedure(s): DIRECT LARYNGOSCOPY (N/A) TRACHEOSTOMY (N/A) Deflated trach cuff.  Not able to move air around trach tube.  Will wait on Passy-Muir valve efforts but OK to initiate swallow evaluation.  May be able to transfer to regular room tomorrow.  Continue Rocephin, culture pending.  LOS: 5 days    Klein Willcox,  Shawna Wearing 03/31/2014

## 2014-03-31 NOTE — Progress Notes (Signed)
Fentanyl approx. 40 ml flushed down sink witnessed by Viviano SimasLauren Robinson, RN Propofol approx. 90 ml flushed down sink witnessed by Viviano SimasLauren Robinson, RN

## 2014-03-31 NOTE — Op Note (Signed)
NAME:  [name] [name], [name] [name]                  ACCOUNT NO.:  [id number]  MEDICAL RECORD NO.:  [id number]  LOCATION:                                 FACILITY:  PHYSICIAN:  Antony Contraswight D Marlita Keil, MD     DATE OF BIRTH:  10-21-1970  DATE OF PROCEDURE:  03/30/2014 DATE OF DISCHARGE:                              OPERATIVE REPORT   PREOPERATIVE DIAGNOSIS:  Acute supraglottitis and airway obstruction.  POSTOPERATIVE DIAGNOSIS:  Acute supraglottitis and airway obstruction.  PROCEDURE: 1. Direct laryngoscopy. 2. Tracheostomy.  SURGEON:  Excell SeltzerDwight D. Jenne PaneBates, MD  ANESTHESIA:  General endotracheal anesthesia.  COMPLICATIONS:  None.  INDICATIONS:  The patient is a 44 year old male, who was admitted early 3 days ago because of acute supraglottitis that required a fiberoptic intubation in the operating room.  He has remained intubated since that time on mechanical ventilation and on intravenous antibiotics.  He was brought back to the operating room today for airway evaluation and potential extubation versus tracheostomy.  FINDINGS:  Upon laryngoscopy, the posterior pharyngeal wall was erythematous.  There was purulent saliva in the pharynx.  The epiglottis was exudative with some bright redness that was friable.  The endolarynx was fairly normal appearing.  A culture was obtained from the epiglottis.  Because of the continued involvement of the infection, a decision was made to proceed with tracheostomy to get the endotracheal tube out of the way.  DESCRIPTION OF PROCEDURE:  The patient was identified in the holding room.  Informed consent had been obtained from his wife including discussion of risks, benefits, and alternatives.  The patient was brought to the operative suite and put on the operative table in a supine position.  General endotracheal anesthesia was continued.  The bed was turned 90 degrees from anesthesia and the eyes were already taped closed.  A tooth guard was placed over the upper teeth  and a Storz laryngoscope was then used to view the pharynx and larynx.  Findings as noted above.  After this was completed, tooth guard was removed and the bed was turned back to anesthesia.  A shoulder roll was placed and the anterior neck was prepped and draped in sterile fashion.  Incision was marked with a marking pen and injected with 1% lidocaine with 1:100,000 epinephrine.  Incision was made with a 15 blade scalpel and subcutaneous fat was then removed with Bovie electrocautery.  The tissues were divided down to the midline raphe of the strap muscles, which were divided exposing the thyroid isthmus.  The isthmus was elevated off the underlying trachea and divided and ligated.  The anterior tracheal wall was then exposed.  A horizontal incision was made between rings 2 and 3 of the trachea and this was extended with scissors.  A 2-0 silk suture was then placed around the ring, above the ring, and below the tracheostomy site as stay sutures.  The endotracheal tube cuff was deflated and the tube was backed out above the tracheostomy site.  An 8- 0 cuffed Shiley trach tube was then placed into the trach site and the cuff was inflated and inner cannula was placed.  The ventilator circuit was attached to the  tracheostomy tube, and the patient was successfully ventilated after a brief period of hypoxia.  The stay sutures were tied with 2 knots above and 1 knot below the trach site.  The trach flange was then secured to the anterior neck skin using 2-0 nylon suture in 4 quadrants.  The patient was then cleaned off and the trach dressing and Velcro trach tie were added.  The patient was returned to anesthesia for transport and was moved back to the intensive care unit in stable condition.     Antony Contras, MD     DDB/MEDQ  D:  03/30/2014  T:  03/31/2014  Job:  [id number]

## 2014-03-31 NOTE — Progress Notes (Signed)
PULMONARY / CRITICAL CARE MEDICINE   Name: [name] [name] MRN: [id number] DOB: April 13, 1970    ADMISSION DATE:  03/26/2014 CONSULTATION DATE:  03/27/14  REFERRING MD :  Jenne PaneBates   CHIEF COMPLAINT:  Epiglottitis   INITIAL PRESENTATION: 44 yo male from presented to [facility] with sore throat, difficulty swallowing, dyspnea, stridor from epiglottitis.  Had laryngoscopy in OR by ENT, and remained on vent afterward.  PCCM consulted post op for vent management.   STUDIES:  1/03 Laryngoscopy >> Supraglottic larynx with marked edema, inflammation with purpuric appearance 1/05 Echo >> mild LVH, EF 55 to 60%  SIGNIFICANT EVENTS: 1/03 to OR, intubated 1/07 Trach in OR  SUBJECTIVE:  Needed trach in OR.  VITAL SIGNS: Temp:  [97.5 F (36.4 C)-100.8 F (38.2 C)] 100.8 F (38.2 C) (01/08 0740) Pulse Rate:  [48-114] 63 (01/08 0900) Resp:  [0-25] 14 (01/08 0900) BP: (96-145)/(48-95) 99/52 mmHg (01/08 0900) SpO2:  [86 %-100 %] 99 % (01/08 0900) FiO2 (%):  [40 %] 40 % (01/08 0740) Weight:  [234 lb 2.1 oz (106.2 kg)] 234 lb 2.1 oz (106.2 kg) (01/08 0450) VENTILATOR SETTINGS: Vent Mode:  [-] PRVC FiO2 (%):  [40 %] 40 % Set Rate:  [14 bmp] 14 bmp Vt Set:  [650 mL] 650 mL PEEP:  [5 cmH20] 5 cmH20 Plateau Pressure:  [15 cmH20-22 cmH20] 18 cmH20 INTAKE / OUTPUT:  Intake/Output Summary (Last 24 hours) at 03/31/14 0957 Last data filed at 03/31/14 0800  Gross per 24 hour  Intake 3714.67 ml  Output   3750 ml  Net -35.33 ml    PHYSICAL EXAMINATION: General: agitated at times Neuro: RASS +1 HEENT: trach site clean Cardiovascular: RRR, no mgr Lungs: few rhonchi bilaterally Abdomen:  Soft, non tender, BS+ Musculoskeletal: no edema, warm  LABS:  CBC  Recent Labs Lab 03/29/14 0631 03/30/14 0337 03/31/14 0255  WBC 11.3* 7.5 8.5  HGB 10.8* 10.8* 11.9*  HCT 34.3* 33.8* 36.6*  PLT 181 196 217   Coag's  Recent Labs Lab 03/27/14 0817  APTT 28  INR 1.22   BMET  Recent Labs Lab  03/29/14 0631 03/30/14 0337 03/31/14 0255  NA 143 144 140  K 3.8 3.8 3.5  CL 116* 114* 109  CO2 21 24 25   BUN 19 13 10   CREATININE 0.94 0.89 0.95  GLUCOSE 97 98 98   Electrolytes  Recent Labs Lab 03/29/14 0631 03/30/14 0337 03/31/14 0255  CALCIUM 8.1* 8.2* 8.6   Sepsis Markers  Recent Labs Lab 03/27/14 0817 03/27/14 1935  LATICACIDVEN 2.1 1.2   ABG  Recent Labs Lab 03/27/14 0339  PHART 7.359  PCO2ART 36.0  PO2ART 104.0*   Liver Enzymes  Recent Labs Lab 03/28/14 0247  AST 15  ALT 12  ALKPHOS 24*  BILITOT 0.3  ALBUMIN 2.8*   Cardiac Enzymes  Recent Labs Lab 03/27/14 0817  TROPONINI <0.03   Glucose  Recent Labs Lab 03/30/14 1549 03/30/14 1920 03/30/14 2036 03/30/14 2335 03/31/14 0336 03/31/14 0842  GLUCAP 87 69* 92 90 95 94    Imaging Dg Abd Portable 1v  03/30/2014   CLINICAL DATA:  Check feeding catheter placement  EXAM: PORTABLE ABDOMEN - 1 VIEW  COMPARISON:  None.  FINDINGS: Scattered large and small bowel gas is noted. A feeding tube is now seen within the stomach directed towards the left. No bony abnormality is noted.  IMPRESSION: Feeding tube within the stomach as described.   Electronically Signed   By: Eulah PontMark  Lukens M.D.  On: 03/30/2014 15:34     ASSESSMENT / PLAN:  PULMONARY ETT 1/3 >> 1/07 Trach (ENT) 1/07 >> A: Acute respiratory failure 2nd to epiglottitis. Hx of OSA. P:   Wean to trach collar >> should not need vent support anymore F/u CXR intermittently Trach care per ENT  CARDIOVASCULAR A: Hypotension 2nd to hypovolemia, sedation > resolved. Hx CAD, HLD. P:  Continue IV fluids Continue ASA, zocor  RENAL A: No acute issues P:   Monitor BMET and UOP Replace electrolytes as needed  GASTROINTESTINAL A: Nutrition. P:   Protonix TF for now Defer to ENT when to try swallow evaluation  HEMATOLOGIC A: Leukocytosis >> resolved. P:  F/u CBC Lovenox for DVT prevention >> resume when okay with  ENT  INFECTIOUS A: Epiglottitis. P:   Day 6 rocephin  Throat 1/07 >> Sputum 1/07 >>   ENDOCRINE A: No active issues. P:   Monitor glucose on chem   NEUROLOGIC A: Agitation. P:   PRN ativan Prn tylenol/percocet/fentanyl for pain control  Updated family at bedside 1/08.  CC time 35 minutes.  Coralyn Helling, MD [facility] 03/31/2014, 10:01 AM Pager:  [phone] After 3pm call: [phone]

## 2014-04-01 LAB — BASIC METABOLIC PANEL
ANION GAP: 10 (ref 5–15)
BUN: 10 mg/dL (ref 6–23)
CALCIUM: 8.7 mg/dL (ref 8.4–10.5)
CO2: 24 mmol/L (ref 19–32)
CREATININE: 0.87 mg/dL (ref 0.50–1.35)
Chloride: 103 mEq/L (ref 96–112)
GFR calc Af Amer: >90 mL/min (ref >90)
GFR calc non Af Amer: >90 mL/min (ref >90)
GLUCOSE: 108 mg/dL — AB (ref 70–99)
POTASSIUM: 3.4 mmol/L — AB (ref 3.5–5.1)
Sodium: 137 mmol/L (ref 135–145)

## 2014-04-01 LAB — CBC
HCT: 36 % — ABNORMAL LOW (ref 39.0–52.0)
Hemoglobin: 11.9 g/dL — ABNORMAL LOW (ref 13.0–17.0)
MCH: 30.3 pg (ref 26.0–34.0)
MCHC: 33.1 g/dL (ref 30.0–36.0)
MCV: 91.6 fL (ref 78.0–100.0)
Platelets: 235 10*3/uL (ref 150–400)
RBC: 3.93 MIL/uL — ABNORMAL LOW (ref 4.22–5.81)
RDW: 12.8 % (ref 11.5–15.5)
WBC: 11.7 10*3/uL — ABNORMAL HIGH (ref 4.0–10.5)

## 2014-04-01 LAB — GLUCOSE, CAPILLARY
GLUCOSE-CAPILLARY: 100 mg/dL — AB (ref 70–99)
Glucose-Capillary: 102 mg/dL — ABNORMAL HIGH (ref 70–99)
Glucose-Capillary: 91 mg/dL (ref 70–99)

## 2014-04-01 MED ORDER — HYDROCODONE-ACETAMINOPHEN 7.5-325 MG/15ML PO SOLN: 15.0000 mL | ORAL | Status: DC | PRN

## 2014-04-01 MED ORDER — PNEUMOCOCCAL VAC POLYVALENT 25 MCG/0.5ML IJ INJ
0.5000 mL | INJECTION | INTRAMUSCULAR | Status: DC | PRN
  Filled 2014-04-01: qty 0.5

## 2014-04-01 MED ORDER — FENTANYL CITRATE 0.05 MG/ML IJ SOLN
100.0000 ug | INTRAMUSCULAR | Status: DC | PRN
  Administered 2014-04-01 – 2014-04-04 (×19): 100 ug via INTRAVENOUS
  Filled 2014-04-01 (×18): qty 2

## 2014-04-01 MED ORDER — FENTANYL CITRATE 0.05 MG/ML IJ SOLN
INTRAMUSCULAR | Status: AC
  Filled 2014-04-01: qty 2

## 2014-04-01 MED ORDER — INFLUENZA VAC SPLIT QUAD 0.5 ML IM SUSY
0.5000 mL | PREFILLED_SYRINGE | INTRAMUSCULAR | Status: DC | PRN
  Filled 2014-04-01: qty 0.5

## 2014-04-01 MED ORDER — HYDROMORPHONE HCL 1 MG/ML PO LIQD: 1.0000 mg | ORAL | Status: DC | PRN

## 2014-04-01 NOTE — Progress Notes (Signed)
Patient ID: [name] [name], male   DOB: 11/14/1970, 44 y.o.   MRN: [id number] Subjective: Breathing well, complaining of an inordinate amount of pain in his throat.  Objective: Vital signs in last 24 hours: Temp:  [97.5 F (36.4 C)-102 F (38.9 C)] 97.5 F (36.4 C) (01/09 0800) Pulse Rate:  [67-97] 67 (01/09 0900) Resp:  [9-30] 26 (01/09 0900) BP: (105-149)/(62-99) 122/95 mmHg (01/09 0900) SpO2:  [93 %-100 %] 95 % (01/09 0900) FiO2 (%):  [28 %] 28 % (01/09 0900) Weight:  [100.8 kg (222 lb 3.6 oz)] 100.8 kg (222 lb 3.6 oz) (01/09 0500) Weight change: -5.4 kg (-11 lb 14.5 oz) Last BM Date: 03/26/14  Intake/Output from previous day: 01/08 0701 - 01/09 0700 In: 1484 [I.V.:1264; NG/GT:120; IV Piggyback:100] Out: 2775 [Urine:2775] Intake/Output this shift: Total I/O In: 50 [I.V.:50] Out: 575 [Urine:575]  PHYSICAL EXAM: Tracheostomy in place, cuff deflated. No swelling of the neck. Moving air without difficulty.  Lab Results:  Recent Labs  03/31/14 0255 04/01/14 0217  WBC 8.5 11.7*  HGB 11.9* 11.9*  HCT 36.6* 36.0*  PLT 217 235   BMET  Recent Labs  03/31/14 0255 04/01/14 0217  NA 140 137  K 3.5 3.4*  CL 109 103  CO2 25 24  GLUCOSE 98 108*  BUN 10 10  CREATININE 0.95 0.87  CALCIUM 8.6 8.7    Studies/Results: Dg Chest Port 1 View  03/31/2014   CLINICAL DATA:  44 year old male status post direct laryngoscopy and tracheostomy, respiratory failure with hypoxia. Initial encounter.  EXAM: PORTABLE CHEST - 1 VIEW  COMPARISON:  03/29/2014 and earlier.  FINDINGS: Portable AP semi upright view at 0559 hrs. Endotracheal tube removed and tracheostomy tube now in place, projects in the midline of the thoracic inlet. Enteric type tube replaced with enteric feeding tube which courses to the abdomen, tip not included. Suggestion of a small caliber vascular catheter at the right neck, but may be external artifact. No central venous line identified.  Stable cardiac size and mediastinal  contours. Bilateral veiling pulmonary opacity with superimposed diffuse interstitial opacity. No pneumothorax.  IMPRESSION: 1. New tracheostomy tube with no adverse features. Feeding tube now in place, coursing to the abdomen with tip not included. 2. Bilateral pleural effusions and lower lobe collapse/consolidation. Suspect superimposed pulmonary interstitial edema.   Electronically Signed   By: Augusto GambleLee  Hall M.D.   On: 03/31/2014 07:47   Dg Abd Portable 1v  03/30/2014   CLINICAL DATA:  Check feeding catheter placement  EXAM: PORTABLE ABDOMEN - 1 VIEW  COMPARISON:  None.  FINDINGS: Scattered large and small bowel gas is noted. A feeding tube is now seen within the stomach directed towards the left. No bony abnormality is noted.  IMPRESSION: Feeding tube within the stomach as described.   Electronically Signed   By: Alcide CleverMark  Lukens M.D.   On: 03/30/2014 15:34    Medications: I have reviewed the patient's current medications.  Assessment/Plan: Stable, tracheostomy, adult epiglottitis. Continue antibiotics. Plan to change trach on Monday or Tuesday. Transfer to floor today.  LOS: 6 days   Hadja Harral 04/01/2014, 11:06 AM

## 2014-04-01 NOTE — Progress Notes (Signed)
PULMONARY / CRITICAL CARE MEDICINE   Name: [name] [name] MRN: [id number] DOB: 1970-12-09    ADMISSION DATE:  03/26/2014 CONSULTATION DATE:  03/27/14  REFERRING MD :  [name]   CHIEF COMPLAINT:  Epiglottitis   INITIAL PRESENTATION: 44 yo male from presented to [facility] with sore throat, difficulty swallowing, dyspnea, stridor from supraglottitis.  Had laryngoscopy in OR by ENT, and remained on vent afterward.  PCCM consulted post op for vent management.   STUDIES:  1/03 Laryngoscopy >> Supraglottic larynx with marked edema, inflammation with purpuric appearance 1/05 Echo >> mild LVH, EF 55 to 60%  SIGNIFICANT EVENTS: 1/03 to OR, intubated 1/07 Trach in OR 1/08 off vent  SUBJECTIVE:  C/o soreness in neck.  VITAL SIGNS: Temp:  [99.7 F (37.6 C)-102 F (38.9 C)] 100.5 F (38.1 C) (01/09 0405) Pulse Rate:  [63-97] 78 (01/09 0700) Resp:  [9-30] 15 (01/09 0700) BP: (99-149)/(52-118) 121/73 mmHg (01/09 0700) SpO2:  [93 %-100 %] 98 % (01/09 0700) FiO2 (%):  [28 %] 28 % (01/09 0400) Weight:  [222 lb 3.6 oz (100.8 kg)] 222 lb 3.6 oz (100.8 kg) (01/09 0500) VENTILATOR SETTINGS: Vent Mode:  [-]  FiO2 (%):  [28 %] 28 % INTAKE / OUTPUT:  Intake/Output Summary (Last 24 hours) at 04/01/14 0843 Last data filed at 04/01/14 0752  Gross per 24 hour  Intake 1186.67 ml  Output   3025 ml  Net -1838.33 ml    PHYSICAL EXAMINATION: General: calm Neuro: normal strength HEENT: trach site clean Cardiovascular: RRR, no mgr Lungs: few rhonchi bilaterally Abdomen:  Soft, non tender, BS+ Musculoskeletal: no edema, warm  LABS:  CBC  Recent Labs Lab 03/30/14 0337 03/31/14 0255 04/01/14 0217  WBC 7.5 8.5 11.7*  HGB 10.8* 11.9* 11.9*  HCT 33.8* 36.6* 36.0*  PLT 196 217 235   Coag's  Recent Labs Lab 03/27/14 0817  APTT 28  INR 1.22   BMET  Recent Labs Lab 03/30/14 0337 03/31/14 0255 04/01/14 0217  NA 144 140 137  K 3.8 3.5 3.4*  CL 114* 109 103  CO2 BUN CREATININE 0.89 0.95 0.87  GLUCOSE 98 98 108*   Electrolytes  Recent Labs Lab 03/30/14 0337 03/31/14 0255 04/01/14 0217  CALCIUM 8.2* 8.6 8.7   Sepsis Markers  Recent Labs Lab 03/27/14 0817 03/27/14 1935  LATICACIDVEN 2.1 1.2   ABG  Recent Labs Lab 03/27/14 0339  PHART 7.359  PCO2ART 36.0  PO2ART 104.0*   Liver Enzymes  Recent Labs Lab 03/28/14 0247  AST 15  ALT 12  ALKPHOS 24*  BILITOT 0.3  ALBUMIN 2.8*   Cardiac Enzymes  Recent Labs Lab 03/27/14 0817  TROPONINI <0.03   Glucose  Recent Labs Lab 03/31/14 0842 03/31/14 1227 03/31/14 1622 03/31/14 1926 03/31/14 2326 04/01/14 0358  GLUCAP 94 92 103* 99 96 100*    Imaging Dg Chest Port 1 View  03/31/2014   CLINICAL DATA:  44 year old male status post direct laryngoscopy and tracheostomy, respiratory failure with hypoxia. Initial encounter.  EXAM: PORTABLE CHEST - 1 VIEW  COMPARISON:  03/29/2014 and earlier.  FINDINGS: Portable AP semi upright view at 0559 hrs. Endotracheal tube removed and tracheostomy tube now in place, projects in the midline of the thoracic inlet. Enteric type tube replaced with enteric feeding tube which courses to the abdomen, tip not included. Suggestion of a small caliber vascular catheter at the right neck, but may be external artifact. No central venous line identified.  Stable cardiac size and mediastinal contours. Bilateral veiling pulmonary opacity with superimposed diffuse interstitial opacity. No pneumothorax.  IMPRESSION: 1. New tracheostomy tube with no adverse features. Feeding tube now in place, coursing to the abdomen with tip not included. 2. Bilateral pleural effusions and lower lobe collapse/consolidation. Suspect superimposed pulmonary interstitial edema.   Electronically Signed   By: Augusto GambleLee  Hall M.D.   On: 03/31/2014 07:47     ASSESSMENT / PLAN:  PULMONARY ETT 1/3 >> 1/07 Trach (ENT) 1/07 >> A: Acute respiratory failure 2nd to supraglottitis. Hx of OSA. P:    Continue trach collar D/c vent F/u CXR intermittently Trach care per ENT Defer speech valve for now  CARDIOVASCULAR A: Hypotension 2nd to hypovolemia, sedation > resolved. Hx CAD, HLD. P:  Continue IV fluids Continue ASA, zocor when able to swallow pills  RENAL A: No acute issues. P:   Monitor BMET and UOP Replace electrolytes as needed  GASTROINTESTINAL A: Nutrition. Dysphagia. P:   Protonix Speech to assess swallow >> then advance diet  HEMATOLOGIC A: Leukocytosis >> resolved. P:  F/u CBC Lovenox for DVT prevention >> resume when okay with ENT  INFECTIOUS A: Epiglottitis. P:   Day 7 rocephin  Throat 1/07 >>  ENDOCRINE A: No active issues. P:   Monitor glucose on chem   NEUROLOGIC A: Agitation >> improved 1/09. P:   PRN ativan Prn tylenol/percocet/fentanyl for pain control OOB, PT assessment  SUMMARY: Slowly improving, but still likely has significant upper airway swelling.  Not needing vent anymore.  Okay to transfer out of ICU >> defer to ENT.  PCCM will sign off.  Please call if additional help is needed.  Coralyn HellingVineet Deandrae Wajda, MD [facility] 04/01/2014, 8:43 AM Pager:  [phone] After 3pm call: [phone]

## 2014-04-02 NOTE — Progress Notes (Signed)
Received a call from RN stating that MD cancelling MBS and instead placing patient on a po diet. Contacted radiology and study cancelled. Will plan for f/u tomorrow for tolerance and to offer assistance with use of compensatory strategies to decrease aspiration risk as indicated.  Ferdinand LangoLeah Axel Meas MA, CCC-SLP [phone]

## 2014-04-02 NOTE — Progress Notes (Signed)
Rehab Admissions Coordinator Note:  Patient was screened by Clois DupesBoyette, Harmony Sandell Godwin for appropriateness for an Inpatient Acute Rehab Consult.  At this time, we are recommending Inpatient Rehab consult. Please place order for OT and inpt rehab consults to assess if he would be a candidate for an inpt rehab admission.  Clois DupesBoyette, Severino Paolo Godwin 04/02/2014, 6:10 PM  I can be reached at [phone].

## 2014-04-02 NOTE — Evaluation (Signed)
Physical Therapy Evaluation Patient Details Name: [name] [name] MRN: [id number] DOB: 1971/01/03 Today's Date: 04/02/2014   History of Present Illness    44 yo male from presented to [facility] with sore throat, difficulty swallowing, dyspnea, stridor from epiglottitis. Had laryngoscopy in OR by ENT, and remained on vent afterward.Trach placed 03/30/14   Clinical Impression  Pt presents with significant limitations to functional mobility related to prolonged bedrest, cardiopulmonary dysfunction and resulting in weakness and deconditioning in this previously independent male.  Able to cover 10 feet ambulation to chair before fatigued, reporting pain at trach site (RN medicated during session).  Recommend OOB often, left RW in room for nursing and therapy needs; placed CIR screening request to assess for potential admission.  Pt will benefit from acute PT to initiate mobility retraining in order to facilitate progress and success in postacute venue of care.  See note for details.  Thanks,    Follow Up Recommendations CIR    Equipment Recommendations  Rolling walker with 5" wheels    Recommendations for Other Services Rehab consult;OT consult     Precautions / Restrictions Precautions Precautions: Other (comment) (trach) Precaution Comments: Pt on 28%/5L flow via trach collar      Mobility  Bed Mobility Overal bed mobility: Modified Independent             General bed mobility comments: HOB 70 degrees, pt/spouse moved to EOB while PT out of room.  Transfers Overall transfer level: Needs assistance Equipment used: 2 person hand held assist Transfers: Sit to/from Stand Sit to Stand: Mod assist;+2 physical assistance;From elevated surface         General transfer comment: raised bed; cues for safest sitting and to use hands and legs to slow descent into recliner pt symptmatic upon standing, sat to recover, able to get seated BP=120s/80 and slightly higher upon standing (see nursing  notes)  Ambulation/Gait Ambulation/Gait assistance: Mod assist;+2 physical assistance Ambulation Distance (Feet): 10 Feet Assistive device: 2 person hand held assist Gait Pattern/deviations: Step-through pattern;Decreased stride length;Shuffle;Trunk flexed;Narrow base of support   Gait velocity interpretation: Below normal speed for age/gender General Gait Details: limited by lightheadedness initially, then weakness in legs after, able to navigate around foot of bed to recliner with much effort and will use RW next session for increase pt comfort  Stairs            Wheelchair Mobility    Modified Rankin (Stroke Patients Only)       Balance Overall balance assessment: Needs assistance Sitting-balance support: Feet supported;No upper extremity supported Sitting balance-Leahy Scale: Fair Sitting balance - Comments: able to sit EOB but needs hands to support and standby assist to don shoes   Standing balance support: Bilateral upper extremity supported;During functional activity Standing balance-Leahy Scale: Poor Standing balance comment: static standing requires at least minimal assist                             Pertinent Vitals/Pain Pain Assessment: 0-10 Pain Score: 10-Worst pain ever Pain Location: trach site Pain Descriptors / Indicators: Grimacing;Sore;Tender Pain Intervention(s): Patient requesting pain meds-RN notified;RN gave pain meds during session (at end of session)    Home Living Family/patient expects to be discharged to:: Private residence Living Arrangements: Spouse/significant other Available Help at Discharge: Family;Available 24 hours/day Type of Home: House Home Access: Stairs to enter Entrance Stairs-Rails: Can reach both Entrance Stairs-Number of Steps: 2 Home Layout: One level Home Equipment: None  Additional Comments: could borrow walker from family, not sure if standard or wheeled    Prior Function Level of Independence:  Independent               Hand Dominance        Extremity/Trunk Assessment   Upper Extremity Assessment: Defer to OT evaluation;Generalized weakness           Lower Extremity Assessment: Generalized weakness      Cervical / Trunk Assessment: Normal  Communication   Communication: Tracheostomy (?started PMV trials?)  Cognition Arousal/Alertness: Awake/alert Behavior During Therapy: WFL for tasks assessed/performed Overall Cognitive Status: Within Functional Limits for tasks assessed                      General Comments General comments (skin integrity, edema, etc.): moderate coughing with mobility, pt able to use suction independently, communicates well despite trach using lip reading and gestures, wife present and helpfully involvedd    Exercises        Assessment/Plan    PT Assessment Patient needs continued PT services  PT Diagnosis Generalized weakness;Difficulty walking   PT Problem List Pain;Cardiopulmonary status limiting activity;Decreased safety awareness;Decreased knowledge of use of DME;Decreased mobility;Decreased balance;Decreased activity tolerance;Decreased strength  PT Treatment Interventions Patient/family education;Balance training;Therapeutic exercise;Therapeutic activities;Functional mobility training;Stair training;Gait training;DME instruction   PT Goals (Current goals can be found in the Care Plan section) Acute Rehab PT Goals Patient Stated Goal: get better, go home PT Goal Formulation: With patient/family Time For Goal Achievement: 04/16/14 Potential to Achieve Goals: Good    Frequency Min 3X/week   Barriers to discharge        Co-evaluation               End of Session Equipment Utilized During Treatment: Gait belt;Oxygen Activity Tolerance: Patient limited by fatigue;Patient limited by pain Patient left: in chair;with call bell/phone within reach;with nursing/sitter in room;with family/visitor present Nurse  Communication: Mobility status;Patient requests pain meds         Time: 1418-1450 PT Time Calculation (min) (ACUTE ONLY): 32 min   Charges:   PT Evaluation $Initial PT Evaluation Tier I: 1 Procedure PT Treatments $Gait Training: 8-22 mins $Therapeutic Activity: 8-22 mins   PT G Codes:        Dennis BastMartin, Briscoe Daniello Galloway 04/02/2014, 3:02 PM

## 2014-04-02 NOTE — Progress Notes (Signed)
Patient ID: [name] [name], male   DOB: 04-Nov-1970, 44 y.o.   MRN: [id number] Subjective: Feeling a little better today, hungry.  Objective: Vital signs in last 24 hours: Temp:  [98.7 F (37.1 C)-99.8 F (37.7 C)] 99.8 F (37.7 C) (01/10 0629) Pulse Rate:  [58-73] 67 (01/10 0924) Resp:  [13-22] 17 (01/10 0629) BP: (125-152)/(82-94) 140/90 mmHg (01/10 0629) SpO2:  [97 %-100 %] 99 % (01/10 0629) FiO2 (%):  [28 %] 28 % (01/10 0924) Weight:  [99 kg (218 lb 4.1 oz)] 99 kg (218 lb 4.1 oz) (01/10 0629) Weight change: -1.8 kg (-3 lb 15.5 oz) Last BM Date: 03/26/14  Intake/Output from previous day: 01/09 0701 - 01/10 0700 In: 1128.3 [I.V.:1078.3; IV Piggyback:50] Out: 1075 [Urine:1075] Intake/Output this shift: Total I/O In: -  Out: 600 [Urine:600]  PHYSICAL EXAM: Trach in place, stable, no swelling.  Lab Results:  Recent Labs  03/31/14 0255 04/01/14 0217  WBC 8.5 11.7*  HGB 11.9* 11.9*  HCT 36.6* 36.0*  PLT 217 235   BMET  Recent Labs  03/31/14 0255 04/01/14 0217  NA 140 137  K 3.5 3.4*  CL 109 103  CO2 25 24  GLUCOSE 98 108*  BUN 10 10  CREATININE 0.95 0.87  CALCIUM 8.6 8.7    Studies/Results: No results found.  Medications: I have reviewed the patient's current medications.  Assessment/Plan: Stable with trach. Start po today, ambulate, change trach tomorrow.   LOS: 7 days   [name] [name] 04/02/2014, 12:12 PM

## 2014-04-02 NOTE — Evaluation (Signed)
Clinical/Bedside Swallow Evaluation Patient Details  Name: [name] [name] MRN: [id number] Date of Birth: 07/28/1970  Today's Date: 04/02/2014 Time: [id number] SLP Time Calculation (min) (ACUTE ONLY): 19 min  Past Medical History:  Past Medical History  Diagnosis Date  . Acute MI   . OSA (obstructive sleep apnea)   . CAD (coronary artery disease)     PTCA and bare metal stent to the RCA 2006   Past Surgical History:  Past Surgical History  Procedure Laterality Date  . Cardiac surgery    . Tracheostomy tube placement N/A 03/27/2014    Procedure: Fiberoptic Intubation;  Surgeon: Christia Readingwight Bates, MD;  [facility];  Service: ENT;  Laterality: N/A;  . Direct laryngoscopy N/A 03/30/2014    Procedure: DIRECT LARYNGOSCOPY;  Surgeon: Christia Readingwight Bates, MD;  [facility];  Service: ENT;  Laterality: N/A;  . Tracheostomy tube placement N/A 03/30/2014    Procedure: TRACHEOSTOMY;  Surgeon: Christia Readingwight Bates, MD;  [facility];  Service: ENT;  Laterality: N/A;   HPI:  44 yo male from presented to [facility] with sore throat, difficulty swallowing, dyspnea, stridor from supraglottitis.Had laryngoscopy in OR by ENT 1/3 , and remained on vent afterward.Tracheostomy 1/7 in OR. Laryngoscopy 1/7: the posterior pharyngeal wall was   Assessment / Plan / Recommendation Clinical Impression  Patient presents with indication of at least a moderate pharyngeal phase dysphagia, suspect as a result of upper airway edema as indicated by both MD notes as well as bedside examination. Cuff deflated at baseline. Patient unable to direct air through upper airway upon finger occlusion despite max clinician cueing. Consistent multiple audible swallows with significant pain reported by patient noted per ice chip trail suggestive of decreased pharyngeal clearance. Given inability to assess vocal quality at baseline for comprehensive eval of swallow and high aspiration risk, recommend proceeding with instrumental testing. FEES preferred over  MBS to visualize upper airway however unable to complete due to equipment being repaired. Will plan for MBS this pm. Prognosis for ability to safety initiate pos guarded severity of deficits at bedside however patient eager to eat and cognitively intact with potential to carryout compensatory strategies which may improve overall functionality of swallow.     Aspiration Risk  Severe    Diet Recommendation NPO   Medication Administration: Via alternative means    Other  Recommendations Recommended Consults: MBS Oral Care Recommendations:  (QID)   Follow Up Recommendations   (TBD)       Pertinent Vitals/Pain n/a        Swallow Study    General HPI: 44 yo male from presented to [facility] with sore throat, difficulty swallowing, dyspnea, stridor from supraglottitis.Had laryngoscopy in OR by ENT 1/3 , and remained on vent afterward.Tracheostomy 1/7 in OR. Laryngoscopy 1/7: the posterior pharyngeal wall was Type of Study: Bedside swallow evaluation Previous Swallow Assessment: none Diet Prior to this Study: NPO (Had panda until 1/8) Temperature Spikes Noted: No Respiratory Status: Trach Trach Size and Type: #8;Cuff;With PMSV not in place;Deflated ("defer PMV for now" per MD notes) History of Recent Intubation: Yes Length of Intubations (days): 4 days Date extubated: 03/30/14 Behavior/Cognition: Alert;Cooperative;Pleasant mood (frustrated with NPO status) Oral Cavity - Dentition: Adequate natural dentition;Missing dentition Self-Feeding Abilities: Able to feed self Patient Positioning: Upright in bed Baseline Vocal Quality:  (unable to direct air through upper airway) Volitional Cough: Strong Volitional Swallow: Able to elicit (with significant pain reported)    Oral/Motor/Sensory Function Overall Oral Motor/Sensory Function: Appears within functional limits for tasks  assessed   Ice Chips Ice chips: Impaired Presentation: Spoon;Self Fed Pharyngeal Phase Impairments: Other (comments)  (multiple loud audible swallows)   Thin Liquid Thin Liquid: Not tested    Nectar Thick Nectar Thick Liquid: Not tested   Honey Thick Honey Thick Liquid: Not tested   Puree Puree: Not tested   Solid   GO   [name] Hilyer MA, CCC-SLP [phone]  Solid: Not tested       [name] [name] 04/02/2014,10:03 AM

## 2014-04-03 LAB — CREATININE, SERUM
Creatinine, Ser: 0.93 mg/dL (ref 0.50–1.35)
GFR calc Af Amer: >90 mL/min (ref >90)
GFR calc non Af Amer: >90 mL/min (ref >90)

## 2014-04-03 MED ORDER — HYDROCODONE-ACETAMINOPHEN 7.5-325 MG/15ML PO SOLN: 15.0000 mL | ORAL | Status: AC | PRN

## 2014-04-03 MED ORDER — AMOXICILLIN-POT CLAVULANATE 600-42.9 MG/5ML PO SUSR: 7.0000 mL | Freq: Two times a day (BID) | ORAL | Status: AC

## 2014-04-03 NOTE — Discharge Summary (Signed)
Physician Discharge Summary  Patient ID: [name] [name] MRN: [id number] DOB/AGE: Oct 27, 1970 43 y.o.  Admit date: 03/26/2014 Discharge date: 04/03/2014  Admission Diagnoses:   Adult supraglottitis   Acute epiglottitis   Acute respiratory distress   Dysphagia   Epiglottitis   Acute respiratory failure with hypoxia   Discharge Diagnoses:  Active Problems:   Adult supraglottitis   Acute epiglottitis   Acute respiratory distress   Dysphagia   Epiglottitis   Acute respiratory failure with hypoxia   Discharged Condition: good  Hospital Course: 44 year old male presented to the ER with sore throat for one day and difficulty breathing. Fiberoptic exam demonstrated obstructing acute supraglottitis so the patient was intubated in the ER.  He remained intubated for several days on Rocephin.  He was taken to the operating room on 1/8 and direct laryngoscopy demonstrated continued intense inflammation of the epiglottis with edema so tracheostomy was performed.  He has progressed well since then with pain control and antibiotic therapy.  Today, fiberoptic exam at the bedside demonstrated much improved epiglottis edema and inflammation, some glottic edema.  His trach tube was changed to a cuffless #6 tube without difficulty.  He was able to breathe around the tube when it is occluded and his voice remains abnormal but he is able to make a voice.  The patient would like to be discharged and I feel this is reasonable.  He and his wife will be given trach care instructions and he will be discharged on Augmentin (culture grew multiple organisms, none predominant) and pain medication and a suction machine with catheters will be arranged at home.  Consults: pulmonary/intensive care  Significant Diagnostic Studies: Fiberoptic laryngoscopy  Treatments: surgery: Direct laryngoscopy, tracheostomy  Discharge Exam: Blood pressure 124/75, pulse 75, temperature 98.9 F (37.2 C), temperature source Oral, resp.  rate 16, height 6\' 2"  (1.88 m), weight 99 kg (218 lb 4.1 oz), SpO2 98 %. General appearance: alert, cooperative and no distress Neck: #8 cuffed trach tube changed to #6 cuffless trach tube without difficulty, wound stable, able to move some are around the occluded trach tube, voice high-pitched and hoarse.  Disposition:      Medication List    ASK your doctor about these medications        aspirin EC 81 MG tablet  Take 81 mg by mouth daily.     ibuprofen 200 MG tablet  Commonly known as:  ADVIL,MOTRIN  Take 800 mg by mouth every 6 (six) hours as needed for fever or mild pain.     simvastatin 20 MG tablet  Commonly known as:  ZOCOR  TAKE ONE TABLET BY MOUTH EVERY DAY AT BEDTIME     TYLENOL COLD 30-2-15-325 MG Tabs  Generic drug:  Pseudoeph-CPM-DM-APAP  Take 2 tablets by mouth daily as needed (FOR FEVER/SYMPTOMS).         SignedJenne Pane: Rajendra Spiller 04/03/2014, 11:44 AM

## 2014-04-03 NOTE — Progress Notes (Signed)
PT Cancellation Note  Patient Details Name: [name] [name] MRN: [id number] DOB: 1970-05-13   Cancelled Treatment:    Reason Eval/Treat Not Completed: Patient at procedure or test/unavailable Holding PT treatment today as OT just exited room and reporting irritation and agitation as pt upset that he is not going home until 1/12. Will follow up as appropriate.   [name], [name] 04/03/2014, 3:39 PM  [name], PT, DPT [phone]

## 2014-04-03 NOTE — Progress Notes (Signed)
SLP Cancellation Note  Patient Details Name: [name] [name] MRN: [id number] DOB: [birth year]   Cancelled treatment:       Reason Eval/Treat Not Completed: Pt asleep upon SLP arrival, and wife politely requested that he be allowed to rest. She does report that he has been eating soft and pureed solids as well as drinking thin liquids with less discomfort and overt coughing. She believes the swelling and pain around his throat have decreased as well. Will return as able to assess tolerance of current diet.    [name] [name], M.A. CCC-SLP [phone]  [name] Hamaiewonsky, [name] 04/03/2014, 10:42 AM

## 2014-04-03 NOTE — Progress Notes (Signed)
OT Cancellation Note  Patient Details Name: [name] HaringMarvin R Shivley MRN: [id number] DOB: 05-26-1970   Cancelled Treatment:    Reason Eval/Treat Not Completed: Other (comment) (Pt adamantly refusing OT) - Pt very angry about delayed discharge and adamantly refused OT services.  "I'd appreciate it if you'd leave now.  I'm not talking to you anymore".  Will reattempt.   Angelene GiovanniConarpe, Mose Colaizzi M  Kaylina Cahue [place], OTR/L [phone]  04/03/2014, 3:01 PM

## 2014-04-03 NOTE — Procedures (Signed)
Preop diagnosis: Acute supraglottitis, tracheostomy status Postop diagnosis: same Procedure: Transnasal fiberoptic laryngoscopy, tracheostomy tube change Surgeon: Jenne PaneBates Anesth: None Compl: None Findings: The epiglottis is no longer edematous but remains a bit inflamed with some exudate to either side.  The glottis is a bit edematous but vocal folds are mobile.  After tracheostomy tube change, the patient is able to move some air around the trach tube when occluded and make a high-pitched, hoarse voice. Description: The patient was placed in a seated position and the flexible laryngoscope was placed through the right nasal passage to view the pharynx and larynx.  Findings above.  The scope was then removed.  He was returned to a supine position and the sutures were removed from the trach tube flange.  The tube was removed with gentle traction and a new, #6 cuffless trach tube was placed easily.  Stay sutures were removed and the velcro trach tie was repositioned.  He tolerated the procedure well.

## 2014-04-03 NOTE — Evaluation (Signed)
Passy-Muir Speaking Valve - Evaluation Patient Details  Name: [name] [name] MRN: [id number] Date of Birth: 1970/10/06  Today's Date: 04/03/2014 Time: [id number] SLP Time Calculation (min) (ACUTE ONLY): 30 min  Past Medical History:  Past Medical History  Diagnosis Date  . Acute MI   . OSA (obstructive sleep apnea)   . CAD (coronary artery disease)     PTCA and bare metal stent to the RCA 2006   Past Surgical History:  Past Surgical History  Procedure Laterality Date  . Cardiac surgery    . Tracheostomy tube placement N/A 03/27/2014    Procedure: Fiberoptic Intubation;  Surgeon: Christia Readingwight Bates, MD;  [facility];  Service: ENT;  Laterality: N/A;  . Direct laryngoscopy N/A 03/30/2014    Procedure: DIRECT LARYNGOSCOPY;  Surgeon: Christia Readingwight Bates, MD;  [facility];  Service: ENT;  Laterality: N/A;  . Tracheostomy tube placement N/A 03/30/2014    Procedure: TRACHEOSTOMY;  Surgeon: Christia Readingwight Bates, MD;  [facility];  Service: ENT;  Laterality: N/A;   HPI:  44 yo male from presented to [facility] with sore throat, difficulty swallowing, dyspnea, stridor from supraglottitis.Had laryngoscopy in OR by ENT 1/3 , and remained on vent afterward.Tracheostomy 1/7 in OR. Laryngoscopy 1/7: the posterior pharyngeal wall was   Assessment / Plan / Recommendation Clinical Impression  Pt has decreased upper airway patency, suspect due to residual swelling and possibly size of trach, although this was downsized today to #6 cuffless. Pt is able to acheive phonation with finger occlusion with Mod I, and both patient and wife demonstrated their ability to don/doff the valve with Mod I. SLP provided Min cues to reduce impulsivity in order to increase safety. Pt acheived audible phonation, although strained, with valve in place. He unfortunately was only able to tolerate the valve for less than one minute at a time before he reported a build up of "pressure" and exhibited increased WOB. Upon removal of the valve, a burst of  air indicated air trapping. Given the above as well as imminent d/c, recommend donning/doffing of the valve only for communication purposes - NOT to leave it in place.     SLP Assessment  Patient needs continued Speech Lanaguage Pathology Services    Follow Up Recommendations  Home health SLP    Frequency and Duration min 2x/week  1 week   Pertinent Vitals/Pain See above    SLP Goals Potential to Achieve Goals (ACUTE ONLY): Good   PMSV Trial  PMSV was placed for: <60 seconds Able to redirect subglottic air through upper airway: Yes Able to Attain Phonation: Yes Voice Quality: Hoarse;Other (comment) (strained) Able to Expectorate Secretions: No Level of Secretion Expectoration with PMSV: Tracheal Breath Support for Phonation: Adequate Intelligibility: Intelligible SpO2 During Trial: 95 % Pulse During Trial: 88 Behavior: Alert;Angry;Cooperative;Responsive to questions   Tracheostomy Tube       Vent Dependency  FiO2 (%): 28 %    Cuff Deflation Trial Tolerated Cuff Deflation:  (cuffless)     [name] HamLaura [name], M.A. CCC-SLP [phone]  [name] [name], [name] [name] 04/03/2014, 2:53 PM

## 2014-04-03 NOTE — Progress Notes (Signed)
I spent over 40 minutes with Mr. [name] [name] and his wife providing trach education. Heather, Case Manager, states that [facility] will not be able to provide necessary home equipment until tomorrow.  [name] were encouraged to stay [facility] until [facility] could provide the necessary equipment needed and informed of the dangers of going home without any equipment to suction or complete trach education.  [name] [name] performed and stated that she feels comfortable with suctioning, cleaning, placing gauze dressing, changing ties and replacing the inner cannula for the trach.  Steps for replacing the trach with the obturator were gone over with a spare trach and she did teach back the steps of inserting the obturator and replacing the inner cannula if necessary.  [name] were informed of the risks of going home with a trach and potential scenarios.  Mr. [name] [name] would not participate in any way with trach care.  He did become very hostile and communicated that he would show us "How bad of a (explicate) he really is."  I attempted to show pt how to suction himself and he refused.  Dr. Jenne PaneBates, Case Management, and the RN were all made aware of this encounter.  I have spoken with [facility] about getting the necessary supplies ready for tomorrow's discharge (discharge tomorrow per Dr. Jenne PaneBates).  I have also spoken with our pulmonary team about potentially reiterating trach education with Mr. [name] [name] and his wife again in the morning before discharge.

## 2014-04-03 NOTE — Progress Notes (Signed)
Patient insistent that IV and continuous pulse ox be taken out/off.  Patient extremely agitated.  Reminded that these are in place for his safety during hospitalization.  Patient refused teaching and both were removed.

## 2014-04-03 NOTE — Progress Notes (Signed)
Speech Language Pathology Treatment: Dysphagia  Patient Details Name: [name] HaringMarvin R Stokely MRN: [id number] DOB: 08-03-1970 Today's Date: 04/03/2014 Time: [id number] SLP Time Calculation (min) (ACUTE ONLY): 10 min  Assessment / Plan / Recommendation Clinical Impression  Pt consumed Dys 1 textures and thin liquids with skilled observation provided by SLP, and PMSV not in place (see evaluation note for additional details). Delayed coughing was observed, although pt reports that his swallowing today is "10 times better". His wife agrees with this progress. With delayed coughing, pt was able to tracheally expectorate what appeared to be secretions, without overt presence of food/liquids from tray. SLP provided education to patient and wife about aspiration precautions.   HPI HPI: 44 yo male from presented to [facility] with sore throat, difficulty swallowing, dyspnea, stridor from supraglottitis.Had laryngoscopy in OR by ENT 1/3 , and remained on vent afterward.Tracheostomy 1/7 in OR. Laryngoscopy 1/7: the posterior pharyngeal wall was   Pertinent Vitals Pain Assessment: No/denies pain  SLP Plan  Continue with current plan of care    Recommendations Diet recommendations: Dysphagia 3 (mechanical soft);Thin liquid (per MD) Liquids provided via: Cup Medication Administration: Crushed with puree Supervision: Patient able to self feed;Full supervision/cueing for compensatory strategies Compensations: Slow rate;Small sips/bites Postural Changes and/or Swallow Maneuvers: Seated upright 90 degrees;Upright 30-60 min after meal              Oral Care Recommendations: Oral care BID Follow up Recommendations: Home health SLP Plan: Continue with current plan of care    GO      Maxcine HamLaura Paiewonsky, M.A. CCC-SLP [phone]  [name], [place] Curl 04/03/2014, 2:36 PM

## 2014-04-03 NOTE — Care Management Note (Signed)
    Page 1 of 2   04/03/2014     1:20:24 PM CARE MANAGEMENT NOTE 04/03/2014  Patient:  [name] [name],[name] [name]   Account Number:  [id number]  Date Initiated:  03/28/2014  Documentation initiated by:  MAYO,HENRIETTA  Subjective/Objective Assessment:   dx epiglottitis; lives with spouse     Action/Plan:   Anticipated DC Date:  04/03/2014   Anticipated DC Plan:  HOME W HOME HEALTH SERVICES      DC Planning Services  CM consult      Choice offered to / List presented to:  C-3 Spouse   DME arranged  WALKER - ROLLING  SUCTION  OXYGEN  TRACH SUPPLIES      DME agency  [facility]     HH arranged  HH-1 RN  HH-2 PT  HH-3 OT  HH-7 RESPIRATORY THERAPY      Status of service:  In process, will continue to follow Medicare Important Message given?   (If response is "NO", the following Medicare IM given date fields will be blank) Date Medicare IM given:   Medicare IM given by:   Date Additional Medicare IM given:   Additional Medicare IM given by:    Discharge Disposition:    Per UR Regulation:  Reviewed for med. necessity/level of care/duration of stay  If discussed at Long Length of Stay Meetings, dates discussed:    Comments:  04-03-14 Confirmed face sheet information with patient's wife [name] [phone] .  [facility] unable to start care until tomorrow 04-04-14 including supplies suction , etc.  Patient  does not qualify for home oxygen , see RN note .  [place] [facility] has to provide teaching also to patient  and caregiver before home health agency can accept referral .  Irving BurtonEmily RT teaching patient and patient's wife at present.   [name] aware of above .   [name] RN BSN [phone]     03/30/14 1502 [name] RN MSN BSN CCM TO OR for laryngoscopy > trach.  03/28/14 1130  [name] RN MSN BSN CCM Spouse works for [employer] but plans to [employer] FMLA as she wants to provide 24/7 assistance to pt when he is medically stable for discharge.

## 2014-04-03 NOTE — Progress Notes (Signed)
Patient reports that he's ready to go home and wife is comfortable with care needed. She has been walking him to the bathroom and assisting with care. Briefly discussed CIR but he's not interested in staying in the hospital longer than necessary.  He reports that his mother and wife will provide assistance as needed past discharge. Will defer CIR consult for now.

## 2014-04-03 NOTE — Progress Notes (Signed)
SATURATION QUALIFICATIONS: (This note is used to comply with regulatory documentation for home oxygen)  Patient Saturations on Room Air at Rest = 96%  Patient Saturations on Room Air while Ambulating = 98%   Please briefly explain why patient needs home oxygen: Per MD request, new Tracheostomy patient.

## 2014-04-04 MED ORDER — PANTOPRAZOLE SODIUM 40 MG PO TBEC: 40.0000 mg | DELAYED_RELEASE_TABLET | Freq: Every day | ORAL | Status: DC

## 2014-04-04 NOTE — Care Management Note (Signed)
[facility] from [facility] . [facility] called patient's wife to begin services and patient's wife declined home health , per Florentina AddisonKatie wife feels like patient is doing so well he does not need home health. Delaney Meigsamara with [name] aware. Ronny FlurryHeather Omayra Tulloch RN BSN

## 2014-04-04 NOTE — Progress Notes (Signed)
Patient ID: Delos HaringMarvin R Mazon, male   DOB: 05/12/1970, 44 y.o.   MRN: [id number] Patient had to stay one more day due to delay in getting home suction equipment in place.  Did well overnight.  Swallowing better.  Making good voice while occluding tube with finger.  Notes from various therapists reviewed. AF VSS Alert NAD #6 cuffless Shiley trach tube in place.  Low-pitched, clear voice made around occluded tube. A/P: Supraglottitis s/p tracheostomy Discharge home today.  [facility] has supplies in place.  Wife quite comfortable with trach tube care.  Follow-up Thursday.

## 2014-04-04 NOTE — Progress Notes (Signed)
Discharge instructions reviewed with pt and pt's wife and pt already had prescriptions.  Pt and pt's wife verbalized understanding and had no questions.  Pt's wife verbalized she understood how to do trach care and suctioning and had no questions.  HH and supplies arranged.  Pt discharged in stable condition via wheelchair with wife.  Hector ShadeMoss, Gillermo Poch [place]

## 2014-04-04 NOTE — Progress Notes (Signed)
Pt seen at this time for follow up trach education before discharge. Pt and wife are both pleasant and state that they feel comfortable & confident with caring for the trach when discharged home.  Questions addressed and answered. Made aware that if they have any new questions or concerns that arise before discharge to call and I will be glad to come back.

## 2014-04-05 LAB — WOUND CULTURE

## 2015-08-12 IMAGING — CR DG CHEST 1V PORT
1 series · 1 of 1 positions shown · non-contrast
Comparison: 03/28/2014.

CLINICAL DATA: Congestion.

EXAM:
PORTABLE CHEST - 1 VIEW

[portable]
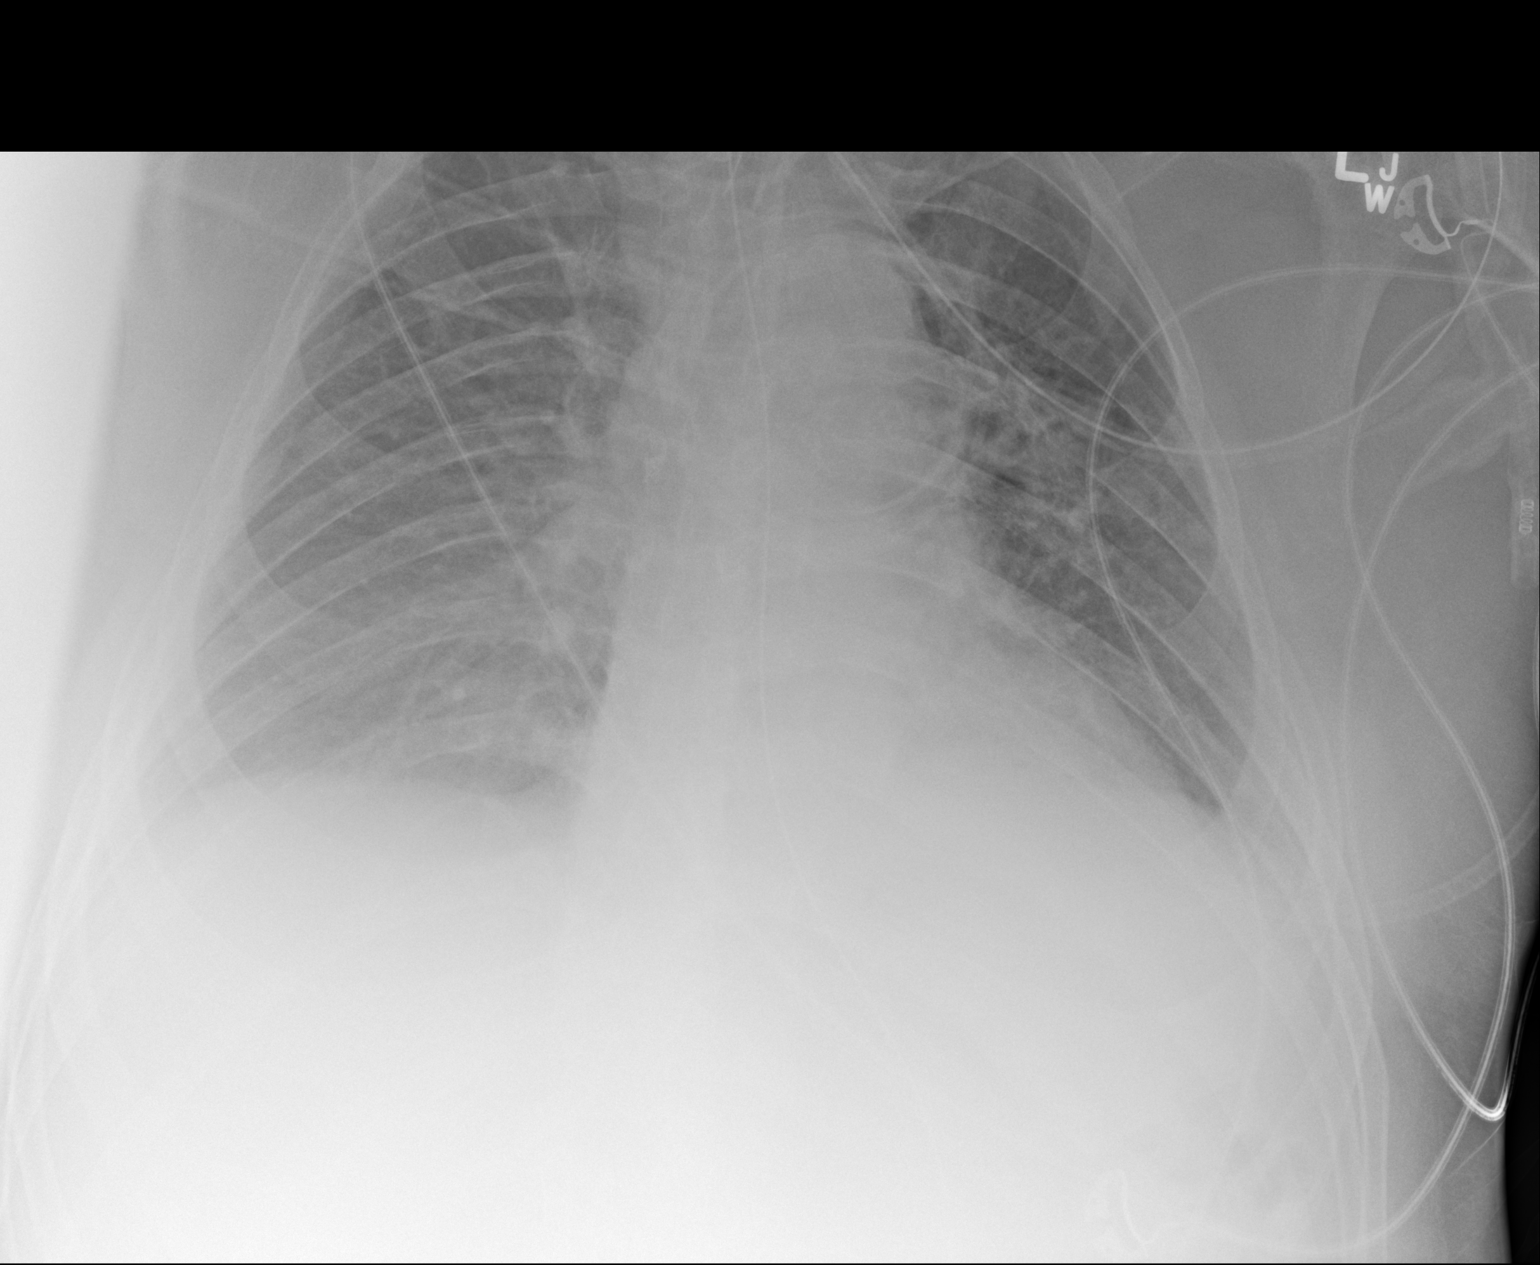

[1 of 1 positions shown; findings below may reference images not displayed]

FINDINGS: Tracheostomy tube and NG tube in stable position. Cardiomegaly with
bilateral interstitial infiltrates and small pleural effusion
consistent with congestive heart failure. No pneumothorax. No acute
bony abnormality .
IMPRESSION: 1. Lines and tubes in stable position.
2. Congestive heart failure with bilateral pulmonary interstitial
edema and small pleural effusions.

## 2015-08-14 IMAGING — CR DG CHEST 1V PORT
1 series · 1 of 1 positions shown · non-contrast
Comparison: 03/29/2014 and earlier.

CLINICAL DATA: 43-year-old male status post direct laryngoscopy and
tracheostomy, respiratory failure with hypoxia. Initial encounter.

EXAM:
PORTABLE CHEST - 1 VIEW

[portable]
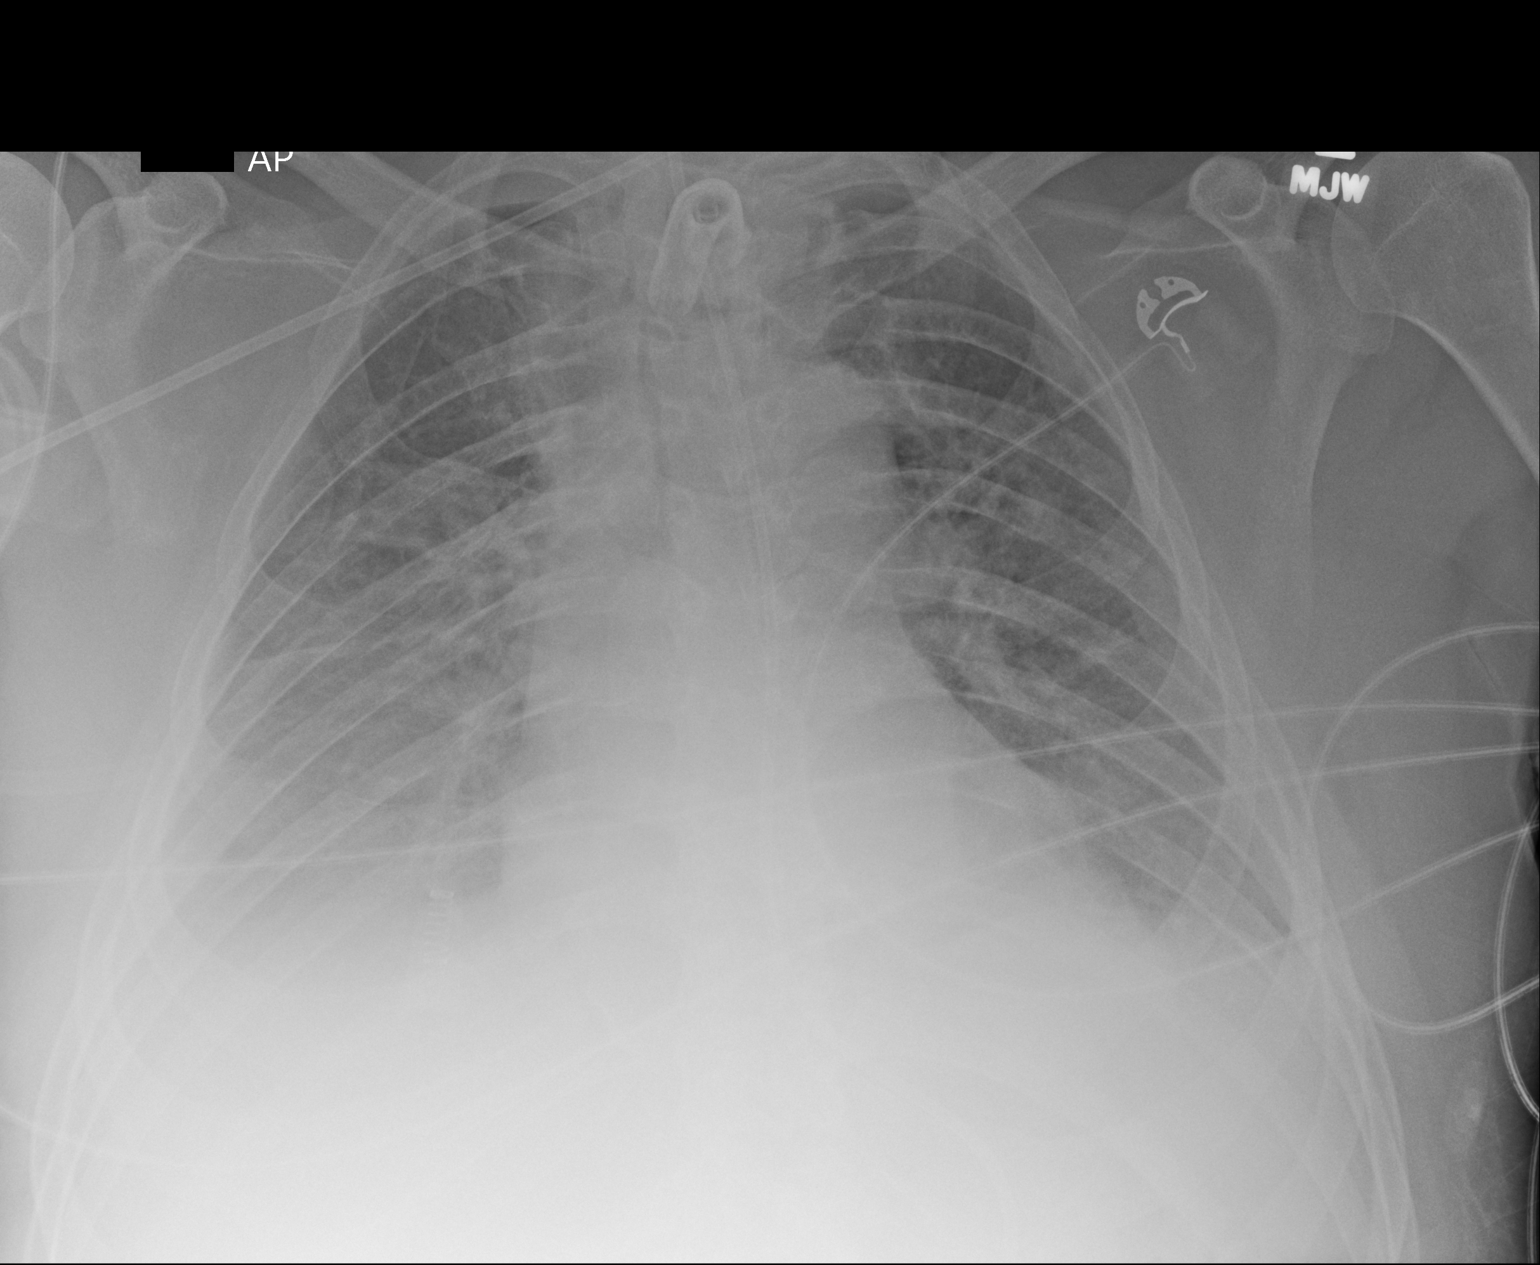

[1 of 1 positions shown; findings below may reference images not displayed]

FINDINGS: Portable AP semi upright view at 1669 hrs. Endotracheal tube removed
and tracheostomy tube now in place, projects in the midline of the
thoracic inlet. Enteric type tube replaced with enteric feeding tube
which courses to the abdomen, tip not included. Suggestion of a
small caliber vascular catheter at the right neck, but may be
external artifact. No central venous line identified.

Stable cardiac size and mediastinal contours. Bilateral veiling
pulmonary opacity with superimposed diffuse interstitial opacity. No
pneumothorax.
IMPRESSION: 1. New tracheostomy tube with no adverse features. Feeding tube now
in place, coursing to the abdomen with tip not included.
2. Bilateral pleural effusions and lower lobe
collapse/consolidation. Suspect superimposed pulmonary interstitial
edema.
